# Patient Record
Sex: Male | Born: 1970
Health system: Southern US, Community
[De-identification: ages and names within clinical notes are randomized; demographics above are authoritative.]

## PROBLEM LIST (undated history)

## (undated) DIAGNOSIS — D0362 Melanoma in situ of left upper limb, including shoulder: Secondary | ICD-10-CM

## (undated) HISTORY — DX: Melanoma in situ of left upper limb, including shoulder: D03.62

---

## 2011-05-02 ENCOUNTER — Emergency Department: Payer: Self-pay | Admitting: Emergency Medicine

## 2011-05-02 LAB — COMPREHENSIVE METABOLIC PANEL
Albumin: 3.5 g/dL (ref 3.4–5.0)
Alkaline Phosphatase: 134 U/L (ref 50–136)
Anion Gap: 9 (ref 7–16)
BUN: 11 mg/dL (ref 7–18)
Bilirubin,Total: 0.4 mg/dL (ref 0.2–1.0)
Calcium, Total: 8.4 mg/dL — ABNORMAL LOW (ref 8.5–10.1)
Chloride: 104 mmol/L (ref 98–107)
Co2: 28 mmol/L (ref 21–32)
Creatinine: 1.09 mg/dL (ref 0.60–1.30)
EGFR (African American): >60
EGFR (Non-African Amer.): >60
Glucose: 86 mg/dL (ref 65–99)
Osmolality: 280 (ref 275–301)
Potassium: 3.9 mmol/L (ref 3.5–5.1)
SGOT(AST): 51 U/L — ABNORMAL HIGH (ref 15–37)
SGPT (ALT): 54 U/L
Sodium: 141 mmol/L (ref 136–145)
Total Protein: 7.4 g/dL (ref 6.4–8.2)

## 2011-05-02 LAB — DRUG SCREEN, URINE

## 2011-05-02 LAB — CBC
HCT: 41.3 % (ref 40.0–52.0)
HGB: 13.1 g/dL (ref 13.0–18.0)
MCH: 25.9 pg — ABNORMAL LOW (ref 26.0–34.0)
MCHC: 31.9 g/dL — ABNORMAL LOW (ref 32.0–36.0)
MCV: 81 fL (ref 80–100)
Platelet: 505 10*3/uL — ABNORMAL HIGH (ref 150–440)
RBC: 5.07 10*6/uL (ref 4.40–5.90)
RDW: 18.8 % — ABNORMAL HIGH (ref 11.5–14.5)
WBC: 18.8 10*3/uL — ABNORMAL HIGH (ref 3.8–10.6)

## 2011-05-02 LAB — ETHANOL
Ethanol %: 0.008 % (ref 0.000–0.080)
Ethanol: 8 mg/dL

## 2011-05-02 LAB — TSH: Thyroid Stimulating Horm: 3.72 u[IU]/mL

## 2011-05-02 LAB — ACETAMINOPHEN LEVEL: Acetaminophen: <2 ug/mL

## 2011-05-02 LAB — SALICYLATE LEVEL: Salicylates, Serum: <1.7 mg/dL

## 2015-01-21 ENCOUNTER — Emergency Department
Admission: EM | Admit: 2015-01-21 | Discharge: 2015-01-21 | Disposition: A | Discharge status: Home / Self Care | Payer: 59 | Attending: Emergency Medicine | Admitting: Emergency Medicine

## 2015-01-21 ENCOUNTER — Emergency Department: Payer: 59

## 2015-01-21 ENCOUNTER — Encounter: Payer: Self-pay | Admitting: Emergency Medicine

## 2015-01-21 DIAGNOSIS — Z880 Allergy status to penicillin: Secondary | ICD-10-CM | POA: Insufficient documentation

## 2015-01-21 DIAGNOSIS — J40 Bronchitis, not specified as acute or chronic: Secondary | ICD-10-CM | POA: Insufficient documentation

## 2015-01-21 DIAGNOSIS — R05 Cough: Secondary | ICD-10-CM | POA: Diagnosis present

## 2015-01-21 MED ORDER — PREDNISONE 10 MG PO TABS: 50.0000 mg | ORAL_TABLET | Freq: Every day | ORAL | Status: DC

## 2015-01-21 MED ORDER — ALBUTEROL SULFATE HFA 108 (90 BASE) MCG/ACT IN AERS: 2.0000 | INHALATION_SPRAY | Freq: Four times a day (QID) | RESPIRATORY_TRACT | Status: DC | PRN

## 2015-01-21 MED ORDER — GUAIFENESIN-CODEINE 100-10 MG/5ML PO SOLN: 10.0000 mL | Freq: Three times a day (TID) | ORAL | Status: DC | PRN

## 2015-01-21 NOTE — ED Notes (Addendum)
Increased shortness of breath more at night and when ambulating.  Pt able to speak in full sentences without difficulty at this time. Has been using wife's inhaler and does not have an inhaler. Coughing at times causes headaches. Pt reports having been told he has seasonal asthma and it is worse when its cold. No medications for asthma. Wife reports hearing him wheeze at night.

## 2015-01-21 NOTE — ED Provider Notes (Signed)
[facility] Emergency Department Provider Note ____________________________________________  Time seen: Approximately 10:49 AM  I have reviewed the triage vital signs and the nursing notes.   HISTORY  Chief Complaint Shortness of Breath and Cough   HPI Phillip Gallegos is a 45 y.o. male who presents to the emergency department for evaluation of shortness of breath and cough. Both are worse at night and in the morning. Wife states that she notices that he is wheezing during the night. The past 2 nights he has had 2 get up and use her albuterol inhaler. He has a history of similar symptoms "when the weather gets cold." His never been officially diagnosed with asthma, but using the inhaler does help the symptoms. He denies fever. He denies other URI symptoms or recent illness.   History reviewed. No pertinent past medical history.  There are no active problems to display for this patient.   History reviewed. No pertinent past surgical history.  Current Outpatient Rx  Name  Route  Sig  Dispense  Refill  . albuterol (PROVENTIL HFA;VENTOLIN HFA) 108 (90 Base) MCG/ACT inhaler   Inhalation   Inhale 2 puffs into the lungs every 6 (six) hours as needed for wheezing or shortness of breath.   1 Inhaler   2   . guaiFENesin-codeine 100-10 MG/5ML syrup   Oral   Take 10 mLs by mouth 3 (three) times daily as needed.   120 mL   0   . predniSONE (DELTASONE) 10 MG tablet   Oral   Take 5 tablets (50 mg total) by mouth daily.   25 tablet   0     Allergies Penicillins  History reviewed. No pertinent family history.  Social History Social History  Substance Use Topics  . Smoking status: Never Smoker   . Smokeless tobacco: None  . Alcohol Use: None    Review of Systems onstitutional: No fever/chills Eyes: No visual changes. ENT: No sore throat. Cardiovascular: Denies chest pain. Respiratory: Positive for shortness of breath. Positive for  cough. Gastrointestinal: Negative for abdominal pain. Negative for nausea,  no vomiting.  No diarrhea.  Genitourinary: Negative for dysuria. Musculoskeletal: Negative for body aches Skin: Negative for rash. Neurological: Negative for headaches, Negative for focal weakness or numbness.  10-point ROS otherwise negative.  ____________________________________________   PHYSICAL EXAM:  VITAL SIGNS: ED Triage Vitals  Enc Vitals Group     BP 01/21/15 1036 147/90 mmHg     Pulse Rate 01/21/15 1036 98     Resp 01/21/15 1036 18     Temp 01/21/15 1036 98 F (36.7 C)     Temp src --      SpO2 01/21/15 1036 95 %     Weight 01/21/15 1036 195 lb (88.451 kg)     Height 01/21/15 1036 6' (1.829 m)     Head Cir --      Peak Flow --      Pain Score 01/21/15 1034 1     Pain Loc --      Pain Edu? --      Excl. in GC? --     Constitutional: Alert and oriented. Well appearing and in no acute distress. Eyes: Conjunctivae are normal. PERRL. EOMI. Ears: Bilateral TM normal Head: Atraumatic. Nose: No congestion/rhinnorhea. Mouth/Throat: Mucous membranes are moist.  Oropharynx non-erythematous. Neck: No stridor.  Lymphatic: No cervical lymphadenopathy. Cardiovascular: Normal rate, regular rhythm. Grossly normal heart sounds.  Good peripheral circulation. Respiratory: Normal respiratory effort.  No retractions. Lungs  clear to auscultation throughout. Gastrointestinal: Soft and nontender. No distention. No abdominal bruits. No CVA tenderness. Musculoskeletal: No joint pain reported. Neurologic:  Normal speech and language. No gross focal neurologic deficits are appreciated. Speech is normal. No gait instability. Skin:  Skin is warm, dry and intact. No rash noted. Psychiatric: Mood and affect are normal. Speech and behavior are normal.  ____________________________________________   LABS (all labs ordered are listed, but only abnormal results are displayed)  Labs Reviewed - No data to  display ____________________________________________  EKG  Not indicated ____________________________________________  RADIOLOGY  Chest x-ray negative for acute abnormality per radiology. ____________________________________________   PROCEDURES  Procedure(s) performed: None  Critical Care performed: No  ____________________________________________   INITIAL IMPRESSION / ASSESSMENT AND PLAN / ED COURSE  Pertinent labs & imaging results that were available during my care of the patient were reviewed by me and considered in my medical decision making (see chart for details).   Patient was advised to follow-up with primary care provider of choice for symptoms that are not improving over the next 2-3 days. He was advised to return to the emergency department for symptoms that change or worsen. He will take a five-day course of prednisone and a albuterol inhaler was written for him to use 2 puffs every 4 hours as needed. ____________________________________________   FINAL CLINICAL IMPRESSION(S) / ED DIAGNOSES  Final diagnoses:  Bronchitis       Chinita PesterCari B Zayd Bonet, FNP 01/21/15 1240  Jene Everyobert Kinner, MD 01/21/15 1530

## 2015-01-21 NOTE — ED Notes (Signed)
Reports cough, congestion and wheezing.  No resp distress at this time.

## 2015-02-26 ENCOUNTER — Telehealth: Payer: Self-pay | Admitting: Internal Medicine

## 2015-02-26 NOTE — Telephone Encounter (Signed)
Left message asking pt to call office to schedule new patient appointment 02/05/15/02/13/15 and 02/22/15.

## 2016-02-06 ENCOUNTER — Ambulatory Visit: Payer: 59 | Admitting: Internal Medicine

## 2016-03-25 ENCOUNTER — Ambulatory Visit (INDEPENDENT_AMBULATORY_CARE_PROVIDER_SITE_OTHER): Payer: 59 | Admitting: Internal Medicine

## 2016-03-25 ENCOUNTER — Encounter: Payer: Self-pay | Admitting: Internal Medicine

## 2016-03-25 ENCOUNTER — Encounter (INDEPENDENT_AMBULATORY_CARE_PROVIDER_SITE_OTHER): Payer: Self-pay

## 2016-03-25 VITALS — BP 132/70 | HR 78 | Temp 98.4°F | Ht 72.0 in | Wt 195.0 lb

## 2016-03-25 DIAGNOSIS — J4520 Mild intermittent asthma, uncomplicated: Secondary | ICD-10-CM | POA: Diagnosis not present

## 2016-03-25 DIAGNOSIS — S46819A Strain of other muscles, fascia and tendons at shoulder and upper arm level, unspecified arm, initial encounter: Secondary | ICD-10-CM | POA: Diagnosis not present

## 2016-03-25 DIAGNOSIS — J45909 Unspecified asthma, uncomplicated: Secondary | ICD-10-CM | POA: Insufficient documentation

## 2016-03-25 MED ORDER — CETIRIZINE HCL 10 MG PO TABS: 10.0000 mg | ORAL_TABLET | Freq: Every day | ORAL | 3 refills | Status: DC

## 2016-03-25 MED ORDER — ALBUTEROL SULFATE HFA 108 (90 BASE) MCG/ACT IN AERS: 2.0000 | INHALATION_SPRAY | Freq: Four times a day (QID) | RESPIRATORY_TRACT | 2 refills | Status: DC | PRN

## 2016-03-25 NOTE — Progress Notes (Signed)
HPI  Pt presents to the clinic today to establish care and for management of the conditions listed below.  Allergy Induced Asthma: Mild intermittent. He takes a Zyrtec daily and Albuterol prn. He is requesting a refill of both medications today.  He also c/o pain between the shoulder blades. This started 3-4 weeks ago. He describes the pain as tight. It is intermittent. It is worse with laying down. He denies any numbness or tingling in his arms. 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 He takes Aleve or Ibuprofen as needed with good relief.   Flu: never Tetanus: > 10 year Vision Screening: yearly  Dentist: biannually  History reviewed. No pertinent past medical history.  Current Outpatient Prescriptions  Medication Sig Dispense Refill  . albuterol (PROVENTIL HFA;VENTOLIN HFA) 108 (90 Base) MCG/ACT inhaler Inhale 2 puffs into the lungs every 6 (six) hours as needed for wheezing or shortness of breath. 1 Inhaler 2  . cetirizine (ZYRTEC) 10 MG tablet Take 10 mg by mouth daily.     No current facility-administered medications for this visit.     Allergies  Allergen Reactions  . Penicillins Rash    Family History  Problem Relation Age of Onset  . Hyperlipidemia Father   . Diabetes Father     Social History   Social History  . Marital status: Married    Spouse name: N/A  . Number of children: N/A  . Years of education: N/A   Occupational History  . Not on file.   Social History Main Topics  . Smoking status: Former Games developermoker  . Smokeless tobacco: Never Used  . Alcohol use Not on file  . Drug use: Unknown  . Sexual activity: Not on file   Other Topics Concern  . Not on file   Social History Narrative  . No narrative on file    ROS:  Constitutional: Denies fever, malaise, fatigue, headache or abrupt weight changes.  HEENT: Denies eye pain, eye redness, ear pain, ringing in the ears, wax buildup, runny nose, nasal congestion,  bloody nose, or sore throat. Respiratory: Denies difficulty breathing, shortness of breath, cough or sputum production.   Cardiovascular: Denies chest pain, chest tightness, palpitations or swelling in the hands or feet.  Gastrointestinal: Denies abdominal pain, bloating, constipation, diarrhea or blood in the stool.  GU: Denies frequency, urgency, pain with urination, blood in urine, odor or discharge. Musculoskeletal: Pt reports upper back pain. Denies decrease in range of motion, difficulty with gait, or joint pain and swelling.  Skin: Denies redness, rashes, lesions or ulcercations.  Neurological: Denies dizziness, difficulty with memory, difficulty with speech or problems with balance and coordination.  Psych: Denies anxiety, depression, SI/HI.  No other specific complaints in a complete review of systems (except as listed in HPI above).  PE:  BP 132/70 (BP Location: Left Arm, Patient Position: Sitting, Cuff Size: Normal)   Pulse 78   Temp 98.4 F (36.9 C) (Oral)   Ht 6' (1.829 m)   Wt 195 lb (88.5 kg)   SpO2 98%   BMI 26.45 kg/m  Wt Readings from Last 3 Encounters:  03/25/16 195 lb (88.5 kg)  01/21/15 195 lb (88.5 kg)    General: Appears his stated age, well developed, well nourished in NAD. Cardiovascular: Normal rate and rhythm. S1,S2 noted.  No murmur, rubs or gallops noted.  Pulmonary/Chest: Normal effort and positive vesicular breath sounds. No respiratory distress. No wheezes, rales or ronchi noted.  Musculoskeletal: Normal flexion, extension and rotation of the neck. No bony tenderness noted over the spine. He has tension noted in his trapezius. Neurological: Alert and oriented. Psychiatric: Mood and affect normal. Behavior is normal. Judgment and thought content normal.     BMET    Component Value Date/Time   NA 141 05/02/2011 2217   K 3.9 05/02/2011 2217   CL 104 05/02/2011 2217   CO2 28 05/02/2011 2217   GLUCOSE 86 05/02/2011 2217   BUN 11 05/02/2011 2217    CREATININE 1.09 05/02/2011 2217   CALCIUM 8.4 (L) 05/02/2011 2217   GFRNONAA >60 05/02/2011 2217   GFRAA >60 05/02/2011 2217    Lipid Panel  No results found for: CHOL, TRIG, HDL, CHOLHDL, VLDL, LDLCALC  CBC    Component Value Date/Time   WBC 18.8 (H) 05/02/2011 2217   RBC 5.07 05/02/2011 2217   HGB 13.1 05/02/2011 2217   HCT 41.3 05/02/2011 2217   PLT 505 (H) 05/02/2011 2217   MCV 81 05/02/2011 2217   MCH 25.9 (L) 05/02/2011 2217   MCHC 31.9 (L) 05/02/2011 2217   RDW 18.8 (H) 05/02/2011 2217    Hgb A1C No results found for: HGBA1C   Assessment and Plan:  Trapezius Strain:  Encouraged proper ergonomics while lifting Continue Ibuprofen or Aleve Consider a heating pad or massage Let me know if this worsens  Make an appt for your annual exam BAITY, REGINA, NP \

## 2016-03-25 NOTE — Assessment & Plan Note (Signed)
Continue Zyrtec and Albuterol Refilled today

## 2016-03-25 NOTE — Patient Instructions (Signed)

## 2016-03-25 NOTE — Progress Notes (Signed)
Pre visit review using our clinic review tool, if applicable. No additional management support is needed unless otherwise documented below in the visit note. 

## 2016-03-31 ENCOUNTER — Encounter: Payer: Self-pay | Admitting: Internal Medicine

## 2016-04-15 ENCOUNTER — Encounter: Payer: Self-pay | Admitting: Internal Medicine

## 2016-05-02 ENCOUNTER — Ambulatory Visit: Payer: 59 | Admitting: Internal Medicine

## 2016-05-05 ENCOUNTER — Ambulatory Visit: Payer: 59 | Admitting: Internal Medicine

## 2016-05-12 IMAGING — CR DG CHEST 2V
1 series · 2 of 2 positions shown · non-contrast
Comparison: 05/03/2011

CLINICAL DATA: Short of breath

EXAM:
CHEST  2 VIEW

[Series 1: w chest pa · 0.14mm/px · 2 of 2 slices shown]
[im 1/2]
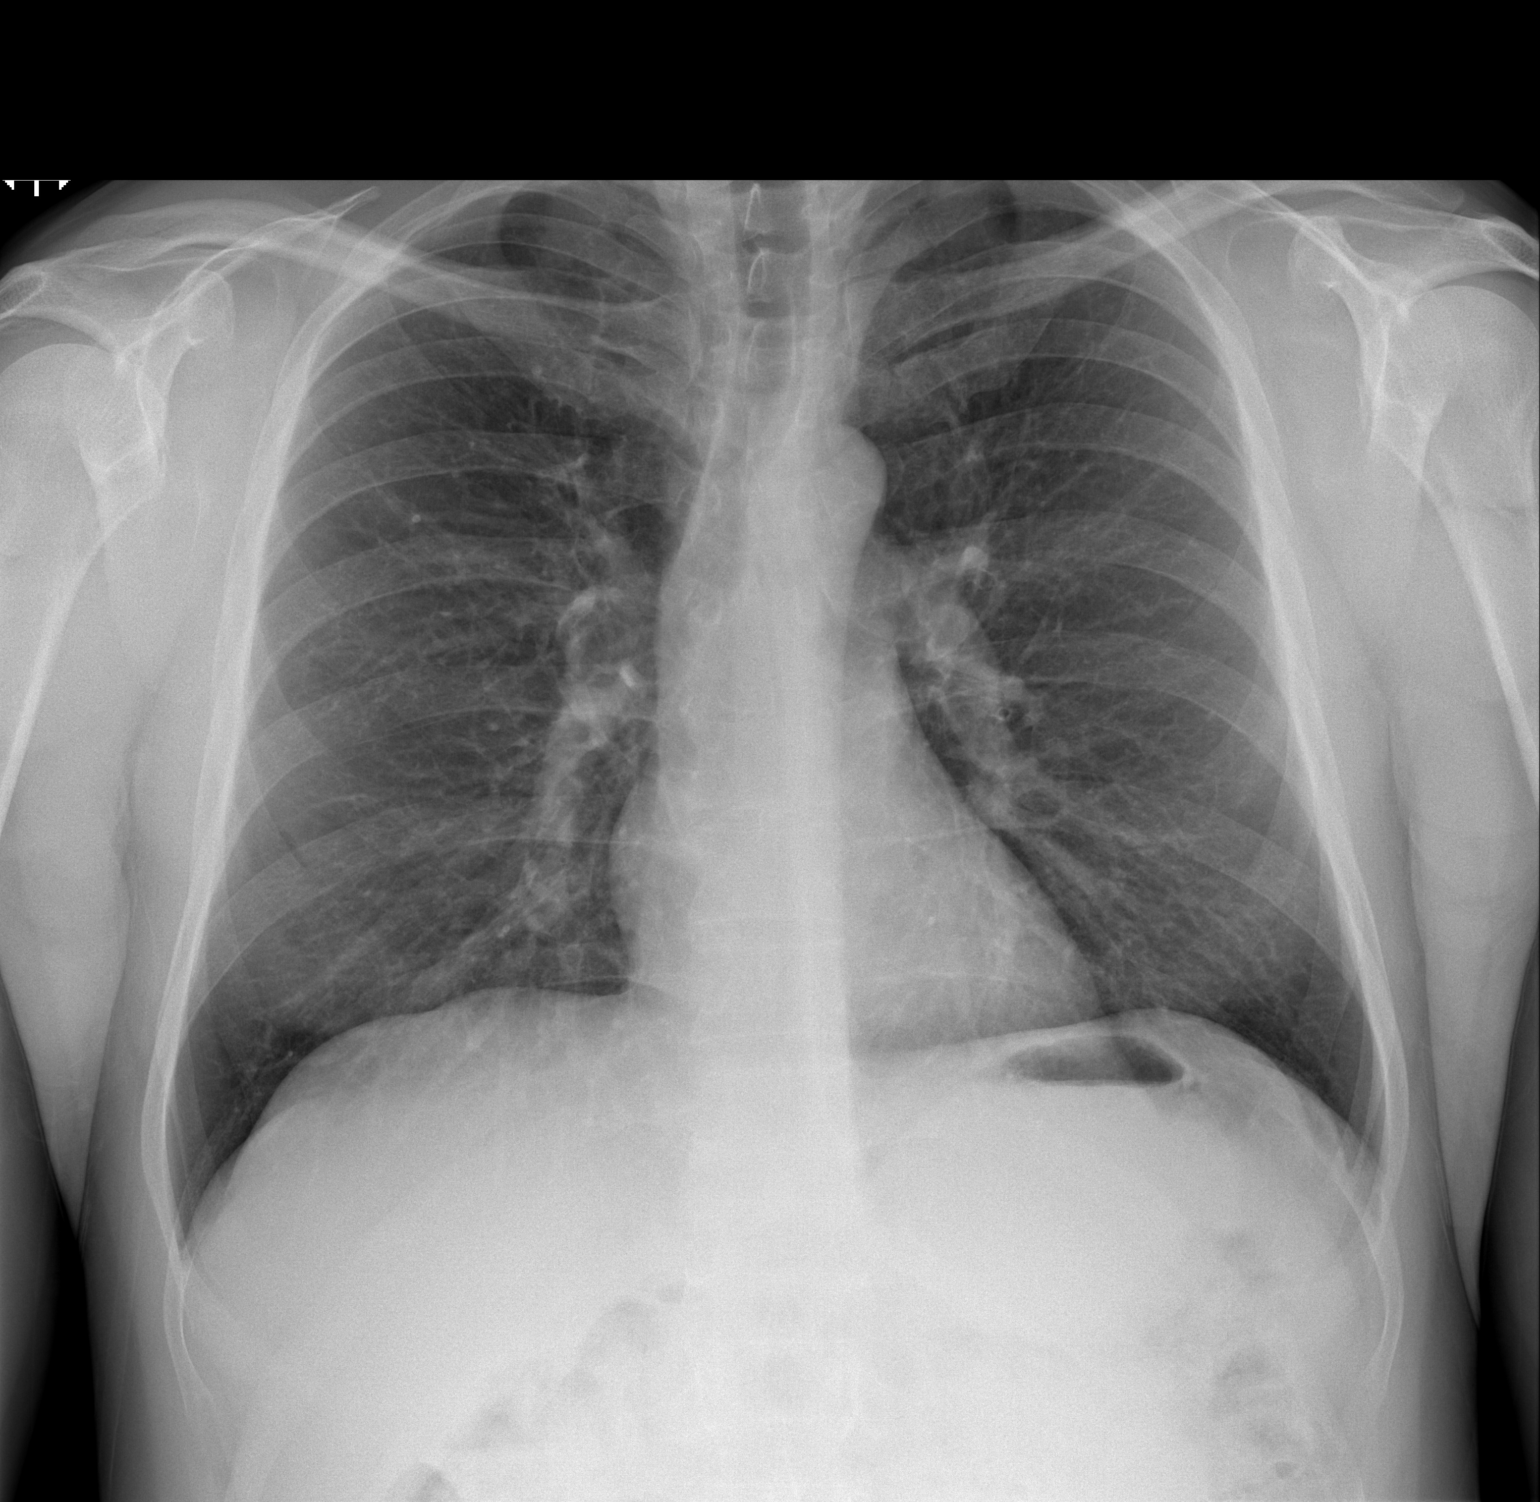
[im 2/2]
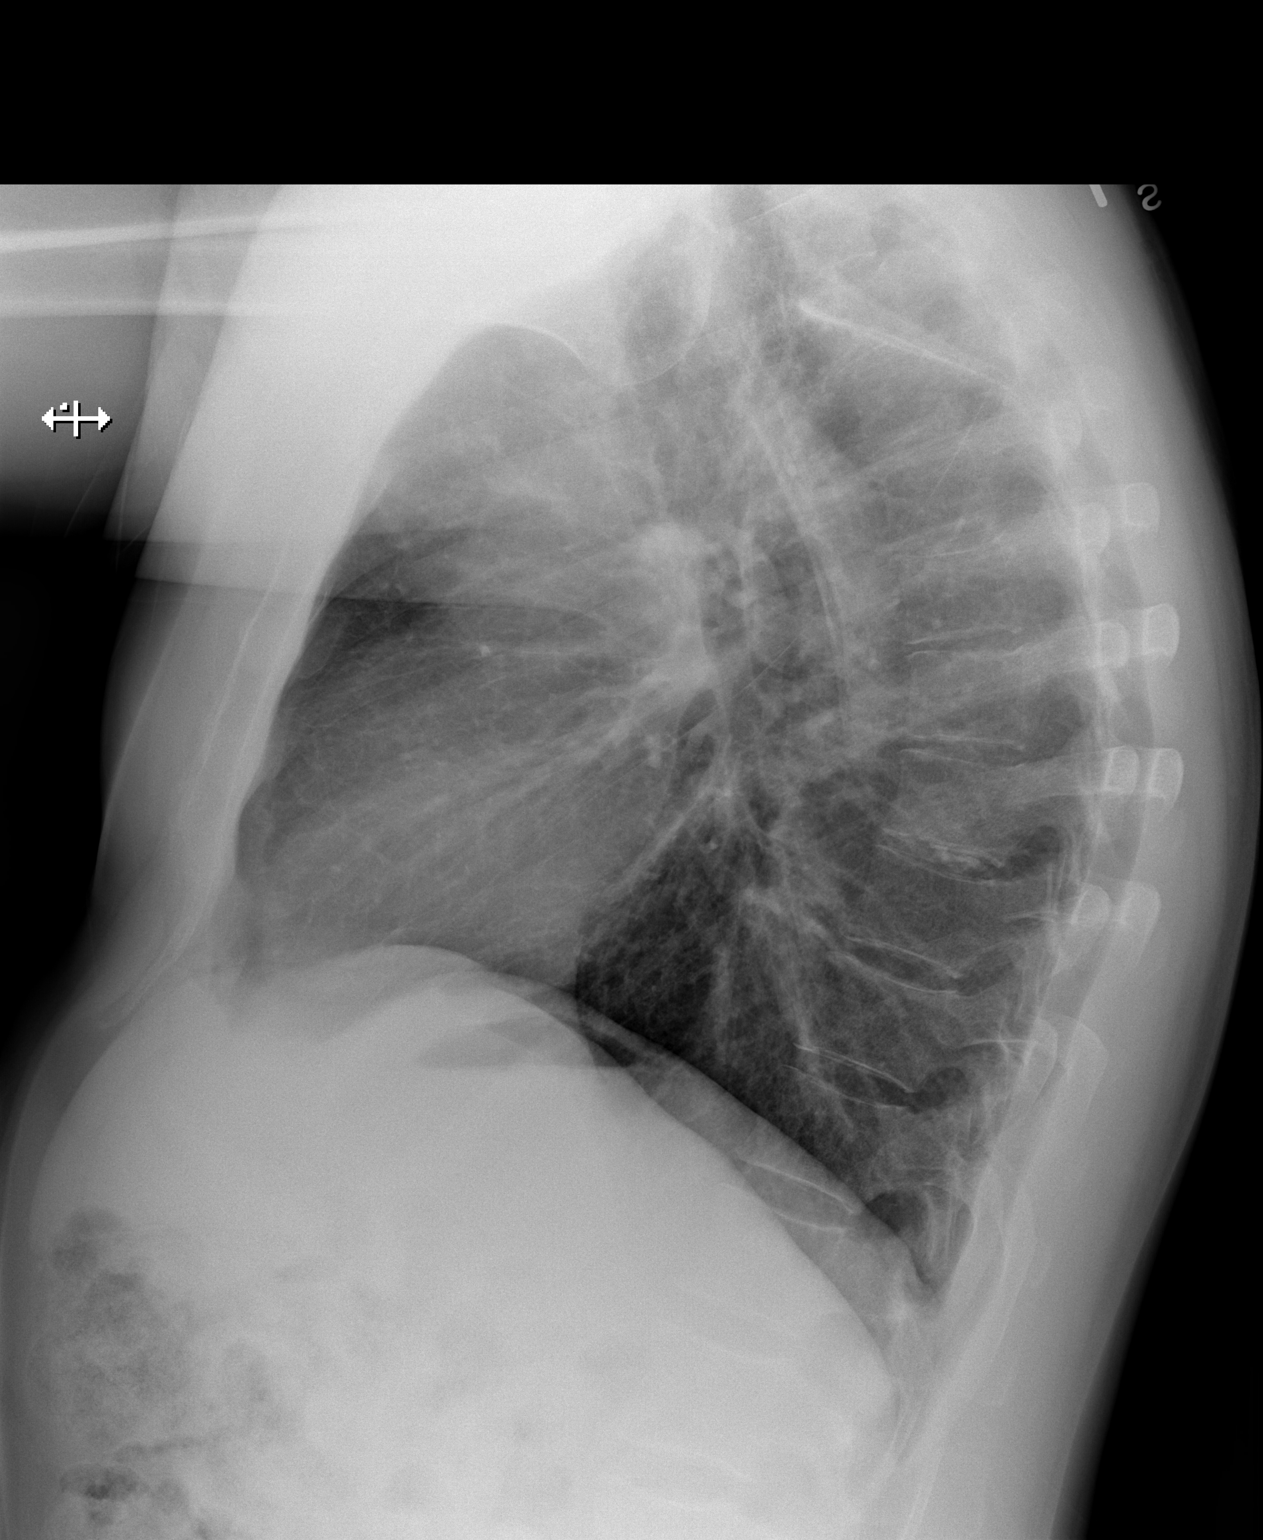

[2 of 2 positions shown; findings below may reference images not displayed]

FINDINGS: Normal heart size. Clear lungs. Lungs are hyperaerated. No
pneumothorax. No pleural effusion.
IMPRESSION: No active cardiopulmonary disease.

## 2016-05-15 ENCOUNTER — Ambulatory Visit (INDEPENDENT_AMBULATORY_CARE_PROVIDER_SITE_OTHER): Payer: 59 | Admitting: Internal Medicine

## 2016-05-15 ENCOUNTER — Encounter: Payer: Self-pay | Admitting: Internal Medicine

## 2016-05-15 VITALS — BP 140/84 | HR 116 | Temp 98.9°F | Wt 198.0 lb

## 2016-05-15 DIAGNOSIS — R0602 Shortness of breath: Secondary | ICD-10-CM | POA: Diagnosis not present

## 2016-05-15 DIAGNOSIS — R059 Cough, unspecified: Secondary | ICD-10-CM

## 2016-05-15 DIAGNOSIS — J4521 Mild intermittent asthma with (acute) exacerbation: Secondary | ICD-10-CM

## 2016-05-15 DIAGNOSIS — R05 Cough: Secondary | ICD-10-CM

## 2016-05-15 MED ORDER — PREDNISONE 10 MG PO TABS: ORAL_TABLET | ORAL | 0 refills | Status: DC

## 2016-05-15 MED ORDER — ALBUTEROL SULFATE (2.5 MG/3ML) 0.083% IN NEBU
2.5000 mg | INHALATION_SOLUTION | Freq: Once | RESPIRATORY_TRACT | Status: AC
  Administered 2016-05-15: 2.5 mg via RESPIRATORY_TRACT

## 2016-05-15 MED ORDER — IPRATROPIUM BROMIDE 0.02 % IN SOLN
0.5000 mg | Freq: Once | RESPIRATORY_TRACT | Status: AC
  Administered 2016-05-15: 0.5 mg via RESPIRATORY_TRACT

## 2016-05-15 MED ORDER — ALBUTEROL SULFATE (2.5 MG/3ML) 0.083% IN NEBU: 2.5000 mg | INHALATION_SOLUTION | RESPIRATORY_TRACT | 1 refills | Status: DC | PRN

## 2016-05-15 MED ORDER — METHYLPREDNISOLONE ACETATE 80 MG/ML IJ SUSP
80.0000 mg | Freq: Once | INTRAMUSCULAR | Status: AC
  Administered 2016-05-15: 80 mg via INTRAMUSCULAR

## 2016-05-15 NOTE — Patient Instructions (Signed)
Asthma, Acute Bronchospasm °Acute bronchospasm caused by asthma is also referred to as an asthma attack. Bronchospasm means your air passages become narrowed. The narrowing is caused by inflammation and tightening of the muscles in the air tubes (bronchi) in your lungs. This can make it hard to breathe or cause you to wheeze and cough. °What are the causes? °Possible triggers are: °· Animal dander from the skin, hair, or feathers of animals. °· Dust mites contained in house dust. °· Cockroaches. °· Pollen from trees or grass. °· Mold. °· Cigarette or tobacco smoke. °· Air pollutants such as dust, household cleaners, hair sprays, aerosol sprays, paint fumes, strong chemicals, or strong odors. °· Cold air or weather changes. Cold air may trigger inflammation. Winds increase molds and pollens in the air. °· Strong emotions such as crying or laughing hard. °· Stress. °· Certain medicines such as aspirin or beta-blockers. °· Sulfites in foods and drinks, such as dried fruits and wine. °· Infections or inflammatory conditions, such as a flu, cold, or inflammation of the nasal membranes (rhinitis). °· Gastroesophageal reflux disease (GERD). GERD is a condition where stomach acid backs up into your esophagus. °· Exercise or strenuous activity. ° °What are the signs or symptoms? °· Wheezing. °· Excessive coughing, particularly at night. °· Chest tightness. °· Shortness of breath. °How is this diagnosed? °Your health care provider will ask you about your medical history and perform a physical exam. A chest X-ray or blood testing may be performed to look for other causes of your symptoms or other conditions that may have triggered your asthma attack. °How is this treated? °Treatment is aimed at reducing inflammation and opening up the airways in your lungs. Most asthma attacks are treated with inhaled medicines. These include quick relief or rescue medicines (such as bronchodilators) and controller medicines (such as inhaled  corticosteroids). These medicines are sometimes given through an inhaler or a nebulizer. Systemic steroid medicine taken by mouth or given through an IV tube also can be used to reduce the inflammation when an attack is moderate or severe. Antibiotic medicines are only used if a bacterial infection is present. °Follow these instructions at home: °· Rest. °· Drink plenty of liquids. This helps the mucus to remain thin and be easily coughed up. Only use caffeine in moderation and do not use alcohol until you have recovered from your illness. °· Do not smoke. Avoid being exposed to secondhand smoke. °· You play a critical role in keeping yourself in good health. Avoid exposure to things that cause you to wheeze or to have breathing problems. °· Keep your medicines up-to-date and available. Carefully follow your health care provider’s treatment plan. °· Take your medicine exactly as prescribed. °· When pollen or pollution is bad, keep windows closed and use an air conditioner or go to places with air conditioning. °· Asthma requires careful medical care. See your health care provider for a follow-up as advised. If you are more than [redacted] weeks pregnant and you were prescribed any new medicines, let your obstetrician know about the visit and how you are doing. Follow up with your health care provider as directed. °· After you have recovered from your asthma attack, make an appointment with your outpatient doctor to talk about ways to reduce the likelihood of future attacks. If you do not have a doctor who manages your asthma, make an appointment with a primary care doctor to discuss your asthma. °Get help right away if: °· You are getting worse. °·   You have trouble breathing. If severe, call your local emergency services (911 in the [place] °· You develop chest pain or discomfort. °· You are vomiting. °· You are not able to keep fluids down. °· You are coughing up yellow, green, brown, or bloody sputum. °· You have a fever  and your symptoms suddenly get worse. °· You have trouble swallowing. °This information is not intended to replace advice given to you by your health care provider. Make sure you discuss any questions you have with your health care provider. °Document Released: 04/23/2006 Document Revised: 06/20/2015 Document Reviewed: 07/14/2012 °Elsevier Interactive Patient Education © 2017 [employer] ° °

## 2016-05-15 NOTE — Progress Notes (Signed)
Pre visit review using our clinic review tool, if applicable. No additional management support is needed unless otherwise documented below in the visit note. 

## 2016-05-15 NOTE — Progress Notes (Signed)
Subjective:    Patient ID: Phillip Gallegos, male    DOB: [birth year], 46 y.o.   MRN: [id number]  HPI  Pt presents to the clinic today with c/o runny nose, cough and shortness of breath. He reports this started yesterday. He is blowing clear mucous out of his nose. The cough is non productive. He has had some chest tightness but denies chest pain. He denies fever, chills or body aches. He has tried his Albuterol inhaler without any relief. He has no history of allergies but does have asthma. He has not had sick contacts that he is aware of.  Review of Systems      History reviewed. No pertinent past medical history.  Current Outpatient Prescriptions  Medication Sig Dispense Refill  . albuterol (PROVENTIL HFA;VENTOLIN HFA) 108 (90 Base) MCG/ACT inhaler Inhale 2 puffs into the lungs every 6 (six) hours as needed for wheezing or shortness of breath. 1 Inhaler 2  . cetirizine (ZYRTEC) 10 MG tablet Take 1 tablet (10 mg total) by mouth daily. 90 tablet 3   No current facility-administered medications for this visit.     Allergies  Allergen Reactions  . Penicillins Rash    Family History  Problem Relation Age of Onset  . Hyperlipidemia Father   . Diabetes Father   . Cancer Neg Hx   . Heart disease Neg Hx   . Stroke Neg Hx     Social History   Social History  . Marital status: Married    Spouse name: N/A  . Number of children: N/A  . Years of education: N/A   Occupational History  . Not on file.   Social History Main Topics  . Smoking status: Former Games developer  . Smokeless tobacco: Never Used  . Alcohol use Yes     Comment: social  . Drug use: No  . Sexual activity: Yes   Other Topics Concern  . Not on file   Social History Narrative  . No narrative on file     Constitutional: Denies fever, malaise, fatigue, headache or abrupt weight changes.  HEENT: Pt reports runny nose. Denies eye pain, eye redness, ear pain, ringing in the ears, wax buildup, nasal congestion,  bloody nose, or sore throat. Respiratory: Pt reports cough and shortness of breath. Denies difficulty breathing, or sputum production.   Cardiovascular: Pt reports chest tightness. Denies chest pain, palpitations or swelling in the hands or feet.   No other specific complaints in a complete review of systems (except as listed in HPI above).  Objective:   Physical Exam  BP 140/84   Pulse (!) 116   Temp 98.9 F (37.2 C) (Oral)   Wt 198 lb (89.8 kg)   SpO2 91%   BMI 26.85 kg/m  Wt Readings from Last 3 Encounters:  05/15/16 198 lb (89.8 kg)  03/25/16 195 lb (88.5 kg)  01/21/15 195 lb (88.5 kg)    General: Appears his stated age, in NAD. Skin: Hot and clammy. HEENT: Head: normal shape and size, no sinus tenderness noted; Ears: Tm's gray and intact, normal light reflex; Nose: mucosa pink and moist, septum midline; Throat/Mouth: Teeth present, mucosa pink and moist, + PND, no exudate, lesions or ulcerations noted.  Neck:  No adenopathy noted Cardiovascular: Tachycardic with normal rhythm. Pulmonary/Chest: Normal effort with bilateral inspiratory and expiratory wheeze noted. No respiratory distress.  BMET    Component Value Date/Time   NA 141 05/02/2011 2217   K 3.9 05/02/2011 2217   CL 104  05/02/2011 2217   CO2 28 05/02/2011 2217   GLUCOSE 86 05/02/2011 2217   BUN 11 05/02/2011 2217   CREATININE 1.09 05/02/2011 2217   CALCIUM 8.4 (L) 05/02/2011 2217   GFRNONAA >60 05/02/2011 2217   GFRAA >60 05/02/2011 2217    Lipid Panel  No results found for: CHOL, TRIG, HDL, CHOLHDL, VLDL, LDLCALC  CBC    Component Value Date/Time   WBC 18.8 (H) 05/02/2011 2217   RBC 5.07 05/02/2011 2217   HGB 13.1 05/02/2011 2217   HCT 41.3 05/02/2011 2217   PLT 505 (H) 05/02/2011 2217   MCV 81 05/02/2011 2217   MCH 25.9 (L) 05/02/2011 2217   MCHC 31.9 (L) 05/02/2011 2217   RDW 18.8 (H) 05/02/2011 2217    Hgb A1C No results found for: HGBA1C          Assessment & Plan:   Asthma  Exacerbation:  Duoneb in office, clinically improved, lungs more open although still wheezing. Sats up to 93%. 80 mg Depo IM today eRx for Pred Taper x 6 days eRx for Albuterol nebs RX for nebulizer machine  RTC as needed or if symptoms persist or worsen Kripa Foskey, NP

## 2016-05-20 ENCOUNTER — Telehealth: Payer: Self-pay

## 2016-05-20 NOTE — Telephone Encounter (Signed)
Dollie with [facility] left /vm requesting cb about possible additional therapy for pts asthma; I called [facility] and Clif[facility] Custard said new policy for [facility] with certain dx; wants to know if Pamala Hurry NP thinks adding a daily inhaler would benefit pt.Please advise. Pt last seen 05/15/16.

## 2016-05-20 NOTE — Telephone Encounter (Signed)
Not at this time. If he continues to have issues, we can consider it.

## 2016-05-20 NOTE — Telephone Encounter (Signed)
Notification faxed back to [facility] with NO pt does not need daily therapy

## 2016-05-22 ENCOUNTER — Encounter: Payer: Self-pay | Admitting: Internal Medicine

## 2016-05-22 ENCOUNTER — Ambulatory Visit (INDEPENDENT_AMBULATORY_CARE_PROVIDER_SITE_OTHER): Payer: 59 | Admitting: Internal Medicine

## 2016-05-22 VITALS — BP 134/80 | HR 76 | Temp 97.9°F | Ht 72.0 in | Wt 181.2 lb

## 2016-05-22 DIAGNOSIS — Z23 Encounter for immunization: Secondary | ICD-10-CM | POA: Diagnosis not present

## 2016-05-22 DIAGNOSIS — Z0000 Encounter for general adult medical examination without abnormal findings: Secondary | ICD-10-CM | POA: Diagnosis not present

## 2016-05-22 DIAGNOSIS — R030 Elevated blood-pressure reading, without diagnosis of hypertension: Secondary | ICD-10-CM

## 2016-05-22 LAB — CBC
HCT: 43.2 % (ref 39.0–52.0)
Hemoglobin: 14 g/dL (ref 13.0–17.0)
MCHC: 32.5 g/dL (ref 30.0–36.0)
MCV: 83.1 fl (ref 78.0–100.0)
Platelets: 513 10*3/uL — ABNORMAL HIGH (ref 150.0–400.0)
RBC: 5.2 Mil/uL (ref 4.22–5.81)
RDW: 16.8 % — ABNORMAL HIGH (ref 11.5–15.5)
WBC: 11.3 10*3/uL — ABNORMAL HIGH (ref 4.0–10.5)

## 2016-05-22 LAB — COMPREHENSIVE METABOLIC PANEL
ALT: 26 U/L (ref 0–53)
AST: 17 U/L (ref 0–37)
Albumin: 4.6 g/dL (ref 3.5–5.2)
Alkaline Phosphatase: 75 U/L (ref 39–117)
BUN: 16 mg/dL (ref 6–23)
CO2: 29 mEq/L (ref 19–32)
Calcium: 9.7 mg/dL (ref 8.4–10.5)
Chloride: 106 mEq/L (ref 96–112)
Creatinine, Ser: 0.7 mg/dL (ref 0.40–1.50)
GFR: 130.25 mL/min (ref >60.00)
Glucose, Bld: 97 mg/dL (ref 70–99)
Potassium: 4.7 mEq/L (ref 3.5–5.1)
Sodium: 139 mEq/L (ref 135–145)
Total Bilirubin: 0.5 mg/dL (ref 0.2–1.2)
Total Protein: 7.9 g/dL (ref 6.0–8.3)

## 2016-05-22 LAB — LIPID PANEL
Cholesterol: 161 mg/dL (ref 0–200)
HDL: 52.4 mg/dL (ref >39.00)
LDL Cholesterol: 99 mg/dL (ref 0–99)
NonHDL: 109.03
Total CHOL/HDL Ratio: 3
Triglycerides: 48 mg/dL (ref 0.0–149.0)
VLDL: 9.6 mg/dL (ref 0.0–40.0)

## 2016-05-22 NOTE — Progress Notes (Signed)
Subjective:    Patient ID: Phillip Gallegos, male    DOB: [birth year], 46 y.o.   MRN: [id number]  HPI  Pt presents to the clinic today for his annual exam.  Flu: 03/2016 Tetanus: > 10 years ago Vision Screening: annually Dentist: biannually  Diet: He does eat meat. He eats a few fruits and veggies. He does not eat fried foods. He drinks mostly water. Exercise: None  Review of Systems      No past medical history on file.  Current Outpatient Prescriptions  Medication Sig Dispense Refill  . albuterol (PROVENTIL HFA;VENTOLIN HFA) 108 (90 Base) MCG/ACT inhaler Inhale 2 puffs into the lungs every 6 (six) hours as needed for wheezing or shortness of breath. 1 Inhaler 2  . albuterol (PROVENTIL) (2.5 MG/3ML) 0.083% nebulizer solution Take 3 mLs (2.5 mg total) by nebulization every 4 (four) hours as needed for wheezing or shortness of breath. 150 mL 1  . cetirizine (ZYRTEC) 10 MG tablet Take 1 tablet (10 mg total) by mouth daily. 90 tablet 3  . predniSONE (DELTASONE) 10 MG tablet Take 6 tabs day 1, 5 tabs day 2, 4 tabs day 3, 3 tabs day 4, 2 tabs day 5, 1 tab day 6 21 tablet 0   No current facility-administered medications for this visit.     Allergies  Allergen Reactions  . Penicillins Rash    Family History  Problem Relation Age of Onset  . Hyperlipidemia Father   . Diabetes Father   . Cancer Neg Hx   . Heart disease Neg Hx   . Stroke Neg Hx     Social History   Social History  . Marital status: Married    Spouse name: N/A  . Number of children: N/A  . Years of education: N/A   Occupational History  . Not on file.   Social History Main Topics  . Smoking status: Former Games developermoker  . Smokeless tobacco: Never Used  . Alcohol use Yes     Comment: social  . Drug use: No  . Sexual activity: Yes   Other Topics Concern  . Not on file   Social History Narrative  . No narrative on file     Constitutional: Denies fever, malaise, fatigue, headache or abrupt weight  changes.  HEENT: Denies eye pain, eye redness, ear pain, ringing in the ears, wax buildup, runny nose, nasal congestion, bloody nose, or sore throat. Respiratory: Denies difficulty breathing, shortness of breath, cough or sputum production.   Cardiovascular: Denies chest pain, chest tightness, palpitations or swelling in the hands or feet.  Gastrointestinal: Denies abdominal pain, bloating, constipation, diarrhea or blood in the stool.  GU: Denies urgency, frequency, pain with urination, burning sensation, blood in urine, odor or discharge. Musculoskeletal: Denies decrease in range of motion, difficulty with gait, muscle pain or joint pain and swelling.  Skin: Denies redness, rashes, lesions or ulcercations.  Neurological: Denies dizziness, difficulty with memory, difficulty with speech or problems with balance and coordination.  Psych: Denies anxiety, depression, SI/HI.  No other specific complaints in a complete review of systems (except as listed in HPI above).  Objective:   Physical Exam  BP 134/80   Pulse 76   Temp 97.9 F (36.6 C) (Oral)   Ht 6' (1.829 m)   Wt 181 lb 4 oz (82.2 kg)   SpO2 98%   BMI 24.58 kg/m  Wt Readings from Last 3 Encounters:  05/22/16 181 lb 4 oz (82.2 kg)  05/15/16 198 lb (  89.8 kg)  03/25/16 195 lb (88.5 kg)    General: Appears his stated age, in NAD. Skin: Warm, dry and intact.  HEENT: Head: normal shape and size; Eyes: sclera white, no icterus, conjunctiva pink, PERRLA and EOMs intact; Ears: Tm's gray and intact, normal light reflex; Throat/Mouth: Teeth present, mucosa pink and moist, no exudate, lesions or ulcerations noted.  Neck:  Neck supple, trachea midline. No masses, lumps or thyromegaly present.  Cardiovascular: Normal rate and rhythm. S1,S2 noted.  No murmur, rubs or gallops noted. No JVD or BLE edema.  Pulmonary/Chest: Normal effort and positive vesicular breath sounds. No respiratory distress. No wheezes, rales or ronchi noted.  Abdomen:  Soft and nontender. Normal bowel sounds. No distention or masses noted. Liver, spleen and kidneys non palpable. Musculoskeletal: Strength 5/5 BUE/BLE. No difficulty with gait.  Neurological: Alert and oriented. Cranial nerves II-XII grossly intact. Coordination normal.  Psychiatric: Mood and affect normal. Behavior is normal. Judgment and thought content normal.     BMET    Component Value Date/Time   NA 141 05/02/2011 2217   K 3.9 05/02/2011 2217   CL 104 05/02/2011 2217   CO2 28 05/02/2011 2217   GLUCOSE 86 05/02/2011 2217   BUN 11 05/02/2011 2217   CREATININE 1.09 05/02/2011 2217   CALCIUM 8.4 (L) 05/02/2011 2217   GFRNONAA >60 05/02/2011 2217   GFRAA >60 05/02/2011 2217    Lipid Panel  No results found for: CHOL, TRIG, HDL, CHOLHDL, VLDL, LDLCALC  CBC    Component Value Date/Time   WBC 18.8 (H) 05/02/2011 2217   RBC 5.07 05/02/2011 2217   HGB 13.1 05/02/2011 2217   HCT 41.3 05/02/2011 2217   PLT 505 (H) 05/02/2011 2217   MCV 81 05/02/2011 2217   MCH 25.9 (L) 05/02/2011 2217   MCHC 31.9 (L) 05/02/2011 2217   RDW 18.8 (H) 05/02/2011 2217    Hgb A1C No results found for: HGBA1C          Assessment & Plan:   Preventative Health Maintenance:  Flu shot UTD Tdap today Encouraged him to consume a balanced diet and exercise regimen Advised him to see an eye doctor and dentist annually Will check CBC, CMET, Lipid profile today  Elevated Blood Pressure:  Better with recent 18 lb weight loss He declines blood pressure medication at this time Will monitor  RTC in 1 year, sooner if needed Nicki Reaper, NP

## 2016-05-22 NOTE — Addendum Note (Signed)
Addended by: Roena MaladyEVONTENNO, Adeeb Konecny Y on: 05/22/2016 04:49 PM   Modules accepted: Orders

## 2016-05-22 NOTE — Patient Instructions (Signed)
 Health Maintenance, Male A healthy lifestyle and preventive care is important for your health and wellness. Ask your health care provider about what schedule of regular examinations is right for you. What should I know about weight and diet?  Eat a Healthy Diet  Eat plenty of vegetables, fruits, whole grains, low-fat dairy products, and lean protein.  Do not eat a lot of foods high in solid fats, added sugars, or salt. Maintain a Healthy Weight  Regular exercise can help you achieve or maintain a healthy weight. You should:  Do at least 150 minutes of exercise each week. The exercise should increase your heart rate and make you sweat (moderate-intensity exercise).  Do strength-training exercises at least twice a week. Watch Your Levels of Cholesterol and Blood Lipids  Have your blood tested for lipids and cholesterol every 5 years starting at 44 years of age. If you are at high risk for heart disease, you should start having your blood tested when you are 44 years old. You may need to have your cholesterol levels checked more often if:  Your lipid or cholesterol levels are high.  You are older than 44 years of age.  You are at high risk for heart disease. What should I know about cancer screening? Many types of cancers can be detected early and may often be prevented. Lung Cancer  You should be screened every year for lung cancer if:  You are a current smoker who has smoked for at least 30 years.  You are a former smoker who has quit within the past 15 years.  Talk to your health care provider about your screening options, when you should start screening, and how often you should be screened. Colorectal Cancer  Routine colorectal cancer screening usually begins at 44 years of age and should be repeated every 5-10 years until you are 44 years old. You may need to be screened more often if early forms of precancerous polyps or small growths are found. Your health care provider  may recommend screening at an earlier age if you have risk factors for colon cancer.  Your health care provider may recommend using home test kits to check for hidden blood in the stool.  A small camera at the end of a tube can be used to examine your colon (sigmoidoscopy or colonoscopy). This checks for the earliest forms of colorectal cancer. Prostate and Testicular Cancer  Depending on your age and overall health, your health care provider may do certain tests to screen for prostate and testicular cancer.  Talk to your health care provider about any symptoms or concerns you have about testicular or prostate cancer. Skin Cancer  Check your skin from head to toe regularly.  Tell your health care provider about any new moles or changes in moles, especially if:  There is a change in a mole's size, shape, or color.  You have a mole that is larger than a pencil eraser.  Always use sunscreen. Apply sunscreen liberally and repeat throughout the day.  Protect yourself by wearing long sleeves, pants, a wide-brimmed hat, and sunglasses when outside. What should I know about heart disease, diabetes, and high blood pressure?  If you are 18-39 years of age, have your blood pressure checked every 3-5 years. If you are 40 years of age or older, have your blood pressure checked every year. You should have your blood pressure measured twice-once when you are at a hospital or clinic, and once when you are not at   a hospital or clinic. Record the average of the two measurements. To check your blood pressure when you are not at a hospital or clinic, you can use:  An automated blood pressure machine at a pharmacy.  A home blood pressure monitor.  Talk to your health care provider about your target blood pressure.  If you are between 45-79 years old, ask your health care provider if you should take aspirin to prevent heart disease.  Have regular diabetes screenings by checking your fasting blood sugar  level.  If you are at a normal weight and have a low risk for diabetes, have this test once every three years after the age of 45.  If you are overweight and have a high risk for diabetes, consider being tested at a younger age or more often.  A one-time screening for abdominal aortic aneurysm (AAA) by ultrasound is recommended for men aged 65-75 years who are current or former smokers. What should I know about preventing infection? Hepatitis B  If you have a higher risk for hepatitis B, you should be screened for this virus. Talk with your health care provider to find out if you are at risk for hepatitis B infection. Hepatitis C  Blood testing is recommended for:  Everyone born from 1945 through 1965.  Anyone with known risk factors for hepatitis C. Sexually Transmitted Diseases (STDs)  You should be screened each year for STDs including gonorrhea and chlamydia if:  You are sexually active and are younger than 44 years of age.  You are older than 44 years of age and your health care provider tells you that you are at risk for this type of infection.  Your sexual activity has changed since you were last screened and you are at an increased risk for chlamydia or gonorrhea. Ask your health care provider if you are at risk.  Talk with your health care provider about whether you are at high risk of being infected with HIV. Your health care provider may recommend a prescription medicine to help prevent HIV infection. What else can I do?  Schedule regular health, dental, and eye exams.  Stay current with your vaccines (immunizations).  Do not use any tobacco products, such as cigarettes, chewing tobacco, and e-cigarettes. If you need help quitting, ask your health care provider.  Limit alcohol intake to no more than 2 drinks per day. One drink equals 12 ounces of beer, 5 ounces of wine, or 1 ounces of hard liquor.  Do not use street drugs.  Do not share needles.  Ask your health  care provider for help if you need support or information about quitting drugs.  Tell your health care provider if you often feel depressed.  Tell your health care provider if you have ever been abused or do not feel safe at home. This information is not intended to replace advice given to you by your health care provider. Make sure you discuss any questions you have with your health care provider. Document Released: 07/05/2007 Document Revised: 09/05/2015 Document Reviewed: 10/10/2014 Elsevier Interactive Patient Education  2017 [employer]  

## 2016-05-23 ENCOUNTER — Encounter: Payer: Self-pay | Admitting: Internal Medicine

## 2016-05-23 MED ORDER — FLUTICASONE PROPIONATE HFA 44 MCG/ACT IN AERO: 1.0000 | INHALATION_SPRAY | Freq: Two times a day (BID) | RESPIRATORY_TRACT | 12 refills | Status: DC

## 2016-07-16 ENCOUNTER — Encounter: Payer: Self-pay | Admitting: Internal Medicine

## 2016-07-17 MED ORDER — FLUTICASONE PROPIONATE HFA 44 MCG/ACT IN AERO: 1.0000 | INHALATION_SPRAY | Freq: Two times a day (BID) | RESPIRATORY_TRACT | 3 refills | Status: DC

## 2016-09-08 ENCOUNTER — Encounter: Payer: Self-pay | Admitting: Internal Medicine

## 2016-09-08 MED ORDER — FLUTICASONE PROPIONATE 50 MCG/ACT NA SUSP: 2.0000 | Freq: Every day | NASAL | 1 refills | Status: DC

## 2016-11-14 ENCOUNTER — Other Ambulatory Visit: Payer: Self-pay

## 2016-11-14 MED ORDER — ALBUTEROL SULFATE HFA 108 (90 BASE) MCG/ACT IN AERS: 2.0000 | INHALATION_SPRAY | Freq: Four times a day (QID) | RESPIRATORY_TRACT | 2 refills | Status: DC | PRN

## 2017-01-10 DIAGNOSIS — J029 Acute pharyngitis, unspecified: Secondary | ICD-10-CM | POA: Diagnosis not present

## 2017-03-06 ENCOUNTER — Ambulatory Visit: Payer: 59 | Admitting: Internal Medicine

## 2017-03-06 ENCOUNTER — Ambulatory Visit: Payer: Self-pay | Admitting: Internal Medicine

## 2017-03-06 VITALS — BP 130/84 | HR 78 | Temp 98.5°F | Wt 193.0 lb

## 2017-03-06 DIAGNOSIS — H6123 Impacted cerumen, bilateral: Secondary | ICD-10-CM | POA: Diagnosis not present

## 2017-03-13 ENCOUNTER — Encounter: Payer: Self-pay | Admitting: Internal Medicine

## 2017-03-13 NOTE — Progress Notes (Signed)
Subjective:    Patient ID: Phillip Gallegos, male    DOB: 1970-11-15, 47 y.o.   MRN: [id number]  HPI  Pt presents to the clinic today with c/o right ear pain. He reports this started 2-3 days ago. He describes the ear pain as achy and full. The pain radiates down to his jaw. He denies drainage from the ear or decreased hearing. He denies runny nose, nasal congestion or sore throat. He has tried Flonase without any relief.  Review of Systems      No past medical history on file.  Current Outpatient Medications  Medication Sig Dispense Refill  . albuterol (PROVENTIL HFA;VENTOLIN HFA) 108 (90 Base) MCG/ACT inhaler Inhale 2 puffs into the lungs every 6 (six) hours as needed for wheezing or shortness of breath. 1 Inhaler 2  . albuterol (PROVENTIL) (2.5 MG/3ML) 0.083% nebulizer solution Take 3 mLs (2.5 mg total) by nebulization every 4 (four) hours as needed for wheezing or shortness of breath. 150 mL 1  . fluticasone (FLONASE) 50 MCG/ACT nasal spray Place 2 sprays into both nostrils daily. 16 g 1  . fluticasone (FLOVENT HFA) 44 MCG/ACT inhaler Inhale 1 puff into the lungs 2 (two) times daily. 3 Inhaler 3  . cetirizine (ZYRTEC) 10 MG tablet Take 1 tablet (10 mg total) by mouth daily. (Patient not taking: Reported on 03/06/2017) 90 tablet 3   No current facility-administered medications for this visit.     Allergies  Allergen Reactions  . Penicillins Rash    Family History  Problem Relation Age of Onset  . Hyperlipidemia Father   . Diabetes Father   . Cancer Neg Hx   . Heart disease Neg Hx   . Stroke Neg Hx     Social History   Socioeconomic History  . Marital status: Married    Spouse name: Not on file  . Number of children: Not on file  . Years of education: Not on file  . Highest education level: Not on file  Social Needs  . Financial resource strain: Not on file  . Food insecurity - worry: Not on file  . Food insecurity - inability: Not on file  . Transportation needs -  medical: Not on file  . Transportation needs - non-medical: Not on file  Occupational History  . Not on file  Tobacco Use  . Smoking status: Former Games developer  . Smokeless tobacco: Never Used  Substance and Sexual Activity  . Alcohol use: Yes    Comment: social  . Drug use: No  . Sexual activity: Yes  Other Topics Concern  . Not on file  Social History Narrative  . Not on file     Constitutional: Denies fever, malaise, fatigue, headache or abrupt weight changes.  HEENT: Pt reports right ear pain. Denies eye pain, eye redness, ringing in the ears, wax buildup, runny nose, nasal congestion, bloody nose, or sore throat.   No other specific complaints in a complete review of systems (except as listed in HPI above).  Objective:   Physical Exam  BP 130/84   Pulse 78   Temp 98.5 F (36.9 C) (Oral)   Wt 193 lb (87.5 kg)   SpO2 98%   BMI 26.18 kg/m  Wt Readings from Last 3 Encounters:  03/06/17 193 lb (87.5 kg)  05/22/16 181 lb 4 oz (82.2 kg)  05/15/16 198 lb (89.8 kg)    General: Appears his stated age, well developed, well nourished in NAD. HEENT:  Ears: Bilateral cerumen impaction;  BMET    Component Value Date/Time   NA 139 05/22/2016 1457   NA 141 05/02/2011 2217   K 4.7 05/22/2016 1457   K 3.9 05/02/2011 2217   CL 106 05/22/2016 1457   CL 104 05/02/2011 2217   CO2 29 05/22/2016 1457   CO2 28 05/02/2011 2217   GLUCOSE 97 05/22/2016 1457   GLUCOSE 86 05/02/2011 2217   BUN 16 05/22/2016 1457   BUN 11 05/02/2011 2217   CREATININE 0.70 05/22/2016 1457   CREATININE 1.09 05/02/2011 2217   CALCIUM 9.7 05/22/2016 1457   CALCIUM 8.4 (L) 05/02/2011 2217   GFRNONAA >60 05/02/2011 2217   GFRAA >60 05/02/2011 2217    Lipid Panel     Component Value Date/Time   CHOL 161 05/22/2016 1457   TRIG 48.0 05/22/2016 1457   HDL 52.40 05/22/2016 1457   CHOLHDL 3 05/22/2016 1457   VLDL 9.6 05/22/2016 1457   LDLCALC 99 05/22/2016 1457    CBC    Component Value Date/Time    WBC 11.3 (H) 05/22/2016 1457   RBC 5.20 05/22/2016 1457   HGB 14.0 05/22/2016 1457   HGB 13.1 05/02/2011 2217   HCT 43.2 05/22/2016 1457   HCT 41.3 05/02/2011 2217   PLT 513.0 (H) 05/22/2016 1457   PLT 505 (H) 05/02/2011 2217   MCV 83.1 05/22/2016 1457   MCV 81 05/02/2011 2217   MCH 25.9 (L) 05/02/2011 2217   MCHC 32.5 05/22/2016 1457   RDW 16.8 (H) 05/22/2016 1457   RDW 18.8 (H) 05/02/2011 2217    Hgb A1C No results found for: HGBA1C          Assessment & Plan:   Bilateral Cerumen Impaction:  Manual lavage by CMA- unsuccessful Provider removed wax with cerumen spoon Advised him to try Debrox 2 x week to prevent wax buildup  Return precautions discussed Nicki ReaperBAITY, REGINA, NP

## 2017-03-13 NOTE — Patient Instructions (Signed)
Place cerumen impaction patient instructions here.

## 2017-03-15 ENCOUNTER — Other Ambulatory Visit: Payer: Self-pay | Admitting: Internal Medicine

## 2017-04-05 ENCOUNTER — Other Ambulatory Visit: Payer: Self-pay | Admitting: Internal Medicine

## 2017-05-22 ENCOUNTER — Encounter: Payer: Self-pay | Admitting: Internal Medicine

## 2017-05-29 ENCOUNTER — Encounter: Payer: Self-pay | Admitting: Internal Medicine

## 2017-05-29 ENCOUNTER — Ambulatory Visit (INDEPENDENT_AMBULATORY_CARE_PROVIDER_SITE_OTHER): Payer: 59 | Admitting: Internal Medicine

## 2017-05-29 VITALS — BP 130/84 | HR 81 | Temp 98.5°F | Ht 72.0 in | Wt 198.0 lb

## 2017-05-29 DIAGNOSIS — J4520 Mild intermittent asthma, uncomplicated: Secondary | ICD-10-CM

## 2017-05-29 DIAGNOSIS — G2581 Restless legs syndrome: Secondary | ICD-10-CM | POA: Diagnosis not present

## 2017-05-29 DIAGNOSIS — Z0001 Encounter for general adult medical examination with abnormal findings: Secondary | ICD-10-CM

## 2017-05-29 DIAGNOSIS — I83813 Varicose veins of bilateral lower extremities with pain: Secondary | ICD-10-CM | POA: Diagnosis not present

## 2017-05-29 MED ORDER — PRAMIPEXOLE DIHYDROCHLORIDE 0.25 MG PO TABS: 0.2500 mg | ORAL_TABLET | Freq: Every day | ORAL | 2 refills | Status: DC

## 2017-05-29 NOTE — Patient Instructions (Signed)

## 2017-05-30 ENCOUNTER — Encounter: Payer: Self-pay | Admitting: Internal Medicine

## 2017-05-30 DIAGNOSIS — I83813 Varicose veins of bilateral lower extremities with pain: Secondary | ICD-10-CM | POA: Insufficient documentation

## 2017-05-30 DIAGNOSIS — G2581 Restless legs syndrome: Secondary | ICD-10-CM | POA: Insufficient documentation

## 2017-05-30 LAB — COMPREHENSIVE METABOLIC PANEL
AG Ratio: 1.6 (calc) (ref 1.0–2.5)
ALT: 31 U/L (ref 9–46)
AST: 28 U/L (ref 10–40)
Albumin: 4.6 g/dL (ref 3.6–5.1)
Alkaline phosphatase (APISO): 80 U/L (ref 40–115)
BUN: 14 mg/dL (ref 7–25)
CO2: 25 mmol/L (ref 20–32)
Calcium: 9.7 mg/dL (ref 8.6–10.3)
Chloride: 103 mmol/L (ref 98–110)
Creat: 0.71 mg/dL (ref 0.60–1.35)
Globulin: 2.8 g/dL (calc) (ref 1.9–3.7)
Glucose, Bld: 89 mg/dL (ref 65–99)
Potassium: 5 mmol/L (ref 3.5–5.3)
Sodium: 138 mmol/L (ref 135–146)
Total Bilirubin: 0.6 mg/dL (ref 0.2–1.2)
Total Protein: 7.4 g/dL (ref 6.1–8.1)

## 2017-05-30 LAB — CBC
HCT: 40.8 % (ref 38.5–50.0)
Hemoglobin: 13.7 g/dL (ref 13.2–17.1)
MCH: 27.3 pg (ref 27.0–33.0)
MCHC: 33.6 g/dL (ref 32.0–36.0)
MCV: 81.4 fL (ref 80.0–100.0)
MPV: 11 fL (ref 7.5–12.5)
Platelets: 467 10*3/uL — ABNORMAL HIGH (ref 140–400)
RBC: 5.01 10*6/uL (ref 4.20–5.80)
RDW: 13.8 % (ref 11.0–15.0)
WBC: 8.3 10*3/uL (ref 3.8–10.8)

## 2017-05-30 LAB — LIPID PANEL
Cholesterol: 160 mg/dL (ref <200)
HDL: 38 mg/dL — ABNORMAL LOW (ref >40)
LDL Cholesterol (Calc): 107 mg/dL (calc) — ABNORMAL HIGH
Non-HDL Cholesterol (Calc): 122 mg/dL (calc) (ref <130)
Total CHOL/HDL Ratio: 4.2 (calc) (ref <5.0)
Triglycerides: 60 mg/dL (ref <150)

## 2017-05-30 LAB — TSH: TSH: 3.4 mIU/L (ref 0.40–4.50)

## 2017-05-30 NOTE — Progress Notes (Signed)
Subjective:    Patient ID: Phillip Gallegos, male    DOB: December 21, 1970, 47 y.o.   MRN: [id number]  HPI  Pt presents to the clinic today for his annual exam. He is also due to follow up chronic conditions.  Allergy Induced Asthma: Controlled on Zyrtec, Flonase, Flovent and Albuterol.  Flu: 08/2016 Tetanus: 05/2016 Vision Screening: annually Dentist: biannually  Diet: He does eat meat. He consumes more fruits than veggies. He tries to avoid fried foods. He drinks mostly water. Exercise: None  Review of Systems      History reviewed. No pertinent past medical history.  Current Outpatient Medications  Medication Sig Dispense Refill  . albuterol (PROVENTIL HFA;VENTOLIN HFA) 108 (90 Base) MCG/ACT inhaler TAKE 2 PUFFS BY MOUTH EVERY 6 HOURS AS NEEDED FOR WHEEZE OR SHORTNESS OF BREATH 8.5 Inhaler 1  . cetirizine (ZYRTEC) 10 MG tablet Take 1 tablet (10 mg total) by mouth daily. 90 tablet 3  . fluticasone (FLONASE) 50 MCG/ACT nasal spray SPRAY 2 SPRAYS INTO EACH NOSTRIL EVERY DAY 16 g 2  . fluticasone (FLOVENT HFA) 44 MCG/ACT inhaler Inhale 1 puff into the lungs 2 (two) times daily. 3 Inhaler 3  . pramipexole (MIRAPEX) 0.25 MG tablet Take 1 tablet (0.25 mg total) by mouth at bedtime. 30 tablet 2   No current facility-administered medications for this visit.     Allergies  Allergen Reactions  . Penicillins Rash    Family History  Problem Relation Age of Onset  . Hyperlipidemia Father   . Diabetes Father   . Cancer Neg Hx   . Heart disease Neg Hx   . Stroke Neg Hx     Social History   Socioeconomic History  . Marital status: Married    Spouse name: Not on file  . Number of children: Not on file  . Years of education: Not on file  . Highest education level: Not on file  Occupational History  . Not on file  Social Needs  . Financial resource strain: Not on file  . Food insecurity:    Worry: Not on file    Inability: Not on file  . Transportation needs:    Medical: Not  on file    Non-medical: Not on file  Tobacco Use  . Smoking status: Former Games developer  . Smokeless tobacco: Never Used  Substance and Sexual Activity  . Alcohol use: Yes    Comment: social  . Drug use: No  . Sexual activity: Yes  Lifestyle  . Physical activity:    Days per week: Not on file    Minutes per session: Not on file  . Stress: Not on file  Relationships  . Social connections:    Talks on phone: Not on file    Gets together: Not on file    Attends religious service: Not on file    Active member of club or organization: Not on file    Attends meetings of clubs or organizations: Not on file    Relationship status: Not on file  . Intimate partner violence:    Fear of current or ex partner: Not on file    Emotionally abused: Not on file    Physically abused: Not on file    Forced sexual activity: Not on file  Other Topics Concern  . Not on file  Social History Narrative  . Not on file     Constitutional: Denies fever, malaise, fatigue, headache or abrupt weight changes.  HEENT: Denies eye pain, eye  redness, ear pain, ringing in the ears, wax buildup, runny nose, nasal congestion, bloody nose, or sore throat. Respiratory: Denies difficulty breathing, shortness of breath, cough or sputum production.   Cardiovascular: Denies chest pain, chest tightness, palpitations or swelling in the hands or feet.  Gastrointestinal: Denies abdominal pain, bloating, constipation, diarrhea or blood in the stool.  GU: Denies urgency, frequency, pain with urination, burning sensation, blood in urine, odor or discharge. Musculoskeletal: Denies decrease in range of motion, difficulty with gait, muscle pain or joint pain and swelling.  Skin: Pt reports painful varicose veins. Denies redness, rashes, lesions or ulcercations.  Neurological: Pt reports restless legs. Denies dizziness, difficulty with memory, difficulty with speech or problems with balance and coordination.  Psych: Denies anxiety,  depression, SI/HI.  No other specific complaints in a complete review of systems (except as listed in HPI above).  Objective:   Physical Exam  BP 130/84   Pulse 81   Temp 98.5 F (36.9 C) (Oral)   Ht 6' (1.829 m)   Wt 198 lb (89.8 kg)   SpO2 98%   BMI 26.85 kg/m  Wt Readings from Last 3 Encounters:  05/29/17 198 lb (89.8 kg)  03/06/17 193 lb (87.5 kg)  05/22/16 181 lb 4 oz (82.2 kg)    General: Appears his stated age, well developed, well nourished in NAD. Skin: Warm, dry and intact. Varicose veins of BLE noted. HEENT: Head: normal shape and size; Eyes: sclera white, no icterus, conjunctiva pink, PERRLA and EOMs intact; Ears: Tm's gray and intact, normal light reflex; Throat/Mouth: Teeth present, mucosa pink and moist, no exudate, lesions or ulcerations noted.  Neck:  Neck supple, trachea midline. No masses, lumps or thyromegaly present.  Cardiovascular: Normal rate and rhythm. S1,S2 noted.  No murmur, rubs or gallops noted. No JVD or BLE edema.  Pulmonary/Chest: Normal effort and positive vesicular breath sounds. No respiratory distress. No wheezes, rales or ronchi noted.  Abdomen: Soft and nontender. Normal bowel sounds. No distention or masses noted. Liver, spleen and kidneys non palpable. Musculoskeletal: Strength 5/5 BUE/BLE. No difficulty with gait.  Neurological: Alert and oriented. Cranial nerves II-XII grossly intact. Coordination normal.  Psychiatric: Mood and affect normal. Behavior is normal. Judgment and thought content normal.     BMET    Component Value Date/Time   NA 138 05/29/2017 1533   NA 141 05/02/2011 2217   K 5.0 05/29/2017 1533   K 3.9 05/02/2011 2217   CL 103 05/29/2017 1533   CL 104 05/02/2011 2217   CO2 25 05/29/2017 1533   CO2 28 05/02/2011 2217   GLUCOSE 89 05/29/2017 1533   GLUCOSE 86 05/02/2011 2217   BUN 14 05/29/2017 1533   BUN 11 05/02/2011 2217   CREATININE 0.71 05/29/2017 1533   CALCIUM 9.7 05/29/2017 1533   CALCIUM 8.4 (L)  05/02/2011 2217   GFRNONAA >60 05/02/2011 2217   GFRAA >60 05/02/2011 2217    Lipid Panel     Component Value Date/Time   CHOL 160 05/29/2017 1533   TRIG 60 05/29/2017 1533   HDL 38 (L) 05/29/2017 1533   CHOLHDL 4.2 05/29/2017 1533   VLDL 9.6 05/22/2016 1457   LDLCALC 107 (H) 05/29/2017 1533    CBC    Component Value Date/Time   WBC 8.3 05/29/2017 1533   RBC 5.01 05/29/2017 1533   HGB 13.7 05/29/2017 1533   HGB 13.1 05/02/2011 2217   HCT 40.8 05/29/2017 1533   HCT 41.3 05/02/2011 2217   PLT 467 (H)  05/29/2017 1533   PLT 505 (H) 05/02/2011 2217   MCV 81.4 05/29/2017 1533   MCV 81 05/02/2011 2217   MCH 27.3 05/29/2017 1533   MCHC 33.6 05/29/2017 1533   RDW 13.8 05/29/2017 1533   RDW 18.8 (H) 05/02/2011 2217    Hgb A1C No results found for: HGBA1C          Assessment & Plan:   Preventative Health Maintenance:  Encouraged him to get a flu shot in the fall Tetanus UTD Encouraged him to consume a balanced diet and exercise regimen Advises him to see an eye doctor and dentist annually Will check CBC, CMET, Lipid, TSH    RTC in 1 year, sooner if needed Nicki Reaper, NP

## 2017-05-30 NOTE — Assessment & Plan Note (Signed)
Controlled on Zyrtec, Flonase, Flovent and Albuterol Will monitor

## 2017-05-30 NOTE — Assessment & Plan Note (Signed)
Will try Mirapex, eRx sent to the pharmacy

## 2017-05-30 NOTE — Assessment & Plan Note (Signed)
Offered refferall to vascular surgery- but he declines at this time Discussed how weight loss may help improve his pain from his varicosities Discussed wearing compression socks

## 2017-06-19 ENCOUNTER — Other Ambulatory Visit: Payer: Self-pay | Admitting: Internal Medicine

## 2017-07-10 ENCOUNTER — Encounter: Payer: Self-pay | Admitting: Internal Medicine

## 2017-07-22 ENCOUNTER — Encounter: Payer: Self-pay | Admitting: Internal Medicine

## 2017-07-25 ENCOUNTER — Encounter: Payer: Self-pay | Admitting: Internal Medicine

## 2017-07-27 ENCOUNTER — Ambulatory Visit: Payer: 59 | Admitting: Internal Medicine

## 2017-07-27 ENCOUNTER — Ambulatory Visit (INDEPENDENT_AMBULATORY_CARE_PROVIDER_SITE_OTHER)
Admission: RE | Admit: 2017-07-27 | Discharge: 2017-07-27 | Disposition: A | Discharge status: Home / Self Care | Payer: 59 | Source: Ambulatory Visit | Attending: Internal Medicine | Admitting: Internal Medicine

## 2017-07-27 ENCOUNTER — Encounter: Payer: Self-pay | Admitting: Internal Medicine

## 2017-07-27 VITALS — BP 126/80 | HR 81 | Temp 98.2°F | Wt 203.0 lb

## 2017-07-27 DIAGNOSIS — M545 Low back pain: Secondary | ICD-10-CM | POA: Diagnosis not present

## 2017-07-27 DIAGNOSIS — M5441 Lumbago with sciatica, right side: Secondary | ICD-10-CM | POA: Diagnosis not present

## 2017-07-27 NOTE — Progress Notes (Signed)
Subjective:    Patient ID: Phillip Gallegos, male    DOB: 05-01-1970, 47 y.o.   MRN: [id number]  HPI  Pt presents to the clinic today to follow up restless legs. At his last visit, we had tried Mirapex which he reports has helped some. He now thinks that this is not restless legs but may be coming from some low back issues. He describes the low back pain as sore, achy and stiff. He has some intermittent numbness in his right thigh, but no tingling or weakness. He denies issues with bowel or bladder. He denies any back injury that he is aware of. He has not taken anything OTC for his symptoms.   Review of Systems      No past medical history on file.  Current Outpatient Medications  Medication Sig Dispense Refill  . albuterol (PROVENTIL HFA;VENTOLIN HFA) 108 (90 Base) MCG/ACT inhaler TAKE 2 PUFFS BY MOUTH EVERY 6 HOURS AS NEEDED FOR WHEEZE OR SHORTNESS OF BREATH 8.5 Inhaler 10  . cetirizine (ZYRTEC) 10 MG tablet Take 1 tablet (10 mg total) by mouth daily. 90 tablet 3  . fluticasone (FLONASE) 50 MCG/ACT nasal spray SPRAY 2 SPRAYS INTO EACH NOSTRIL EVERY DAY 16 g 2  . fluticasone (FLOVENT HFA) 44 MCG/ACT inhaler Inhale 1 puff into the lungs 2 (two) times daily. 3 Inhaler 3  . pramipexole (MIRAPEX) 0.25 MG tablet Take 1 tablet (0.25 mg total) by mouth at bedtime. 30 tablet 2   No current facility-administered medications for this visit.     Allergies  Allergen Reactions  . Penicillins Rash    Family History  Problem Relation Age of Onset  . Hyperlipidemia Father   . Diabetes Father   . Cancer Neg Hx   . Heart disease Neg Hx   . Stroke Neg Hx     Social History   Socioeconomic History  . Marital status: Married    Spouse name: Not on file  . Number of children: Not on file  . Years of education: Not on file  . Highest education level: Not on file  Occupational History  . Not on file  Social Needs  . Financial resource strain: Not on file  . Food insecurity:    Worry:  Not on file    Inability: Not on file  . Transportation needs:    Medical: Not on file    Non-medical: Not on file  Tobacco Use  . Smoking status: Former Games developermoker  . Smokeless tobacco: Never Used  Substance and Sexual Activity  . Alcohol use: Yes    Comment: social  . Drug use: No  . Sexual activity: Yes  Lifestyle  . Physical activity:    Days per week: Not on file    Minutes per session: Not on file  . Stress: Not on file  Relationships  . Social connections:    Talks on phone: Not on file    Gets together: Not on file    Attends religious service: Not on file    Active member of club or organization: Not on file    Attends meetings of clubs or organizations: Not on file    Relationship status: Not on file  . Intimate partner violence:    Fear of current or ex partner: Not on file    Emotionally abused: Not on file    Physically abused: Not on file    Forced sexual activity: Not on file  Other Topics Concern  . Not on  file  Social History Narrative  . Not on file     Constitutional: Denies fever, malaise, fatigue, headache or abrupt weight changes.  Gastrointestinal: Denies abdominal pain, bloating, constipation, diarrhea or blood in the stool.  GU: Denies urgency, frequency, pain with urination, burning sensation, blood in urine, odor or discharge. Musculoskeletal: Pt reports low back pain. Denies decrease in range of motion, difficulty with gait, muscle pain or joint swelling.  Skin: Denies redness, rashes, lesions or ulcercations.  Neurological: Pt reports numbness of right thigh. Denies dizziness, difficulty with memory, difficulty with speech or problems with balance and coordination.    No other specific complaints in a complete review of systems (except as listed in HPI above).  Objective:   Physical Exam   BP 126/80   Pulse 81   Temp 98.2 F (36.8 C) (Oral)   Wt 203 lb (92.1 kg)   SpO2 98%   BMI 27.53 kg/m  Wt Readings from Last 3 Encounters:    07/27/17 203 lb (92.1 kg)  05/29/17 198 lb (89.8 kg)  03/06/17 193 lb (87.5 kg)    General: Appears his stated age, well developed, well nourished in NAD. Skin: Warm, dry and intact.  Musculoskeletal: Normal flexion, extension, rotation and lateral bending of the spine. Bony tenderness noted over the lumbar spine. Strength 5/5 BUE/BLE. No difficulty with gait.  Neurological: Alert and oriented. Negative SLR on the right.   BMET    Component Value Date/Time   NA 138 05/29/2017 1533   NA 141 05/02/2011 2217   K 5.0 05/29/2017 1533   K 3.9 05/02/2011 2217   CL 103 05/29/2017 1533   CL 104 05/02/2011 2217   CO2 25 05/29/2017 1533   CO2 28 05/02/2011 2217   GLUCOSE 89 05/29/2017 1533   GLUCOSE 86 05/02/2011 2217   BUN 14 05/29/2017 1533   BUN 11 05/02/2011 2217   CREATININE 0.71 05/29/2017 1533   CALCIUM 9.7 05/29/2017 1533   CALCIUM 8.4 (L) 05/02/2011 2217   GFRNONAA >60 05/02/2011 2217   GFRAA >60 05/02/2011 2217    Lipid Panel     Component Value Date/Time   CHOL 160 05/29/2017 1533   TRIG 60 05/29/2017 1533   HDL 38 (L) 05/29/2017 1533   CHOLHDL 4.2 05/29/2017 1533   VLDL 9.6 05/22/2016 1457   LDLCALC 107 (H) 05/29/2017 1533    CBC    Component Value Date/Time   WBC 8.3 05/29/2017 1533   RBC 5.01 05/29/2017 1533   HGB 13.7 05/29/2017 1533   HGB 13.1 05/02/2011 2217   HCT 40.8 05/29/2017 1533   HCT 41.3 05/02/2011 2217   PLT 467 (H) 05/29/2017 1533   PLT 505 (H) 05/02/2011 2217   MCV 81.4 05/29/2017 1533   MCV 81 05/02/2011 2217   MCH 27.3 05/29/2017 1533   MCHC 33.6 05/29/2017 1533   RDW 13.8 05/29/2017 1533   RDW 18.8 (H) 05/02/2011 2217    Hgb A1C No results found for: HGBA1C         Assessment & Plan:   Acute Low Back Pain with Left Side Sciatica:  Xray lumbar spine today Discussed stretching, NSAID's If persists or worsens, consider PT, may eventually need MRI  Return precautions discussed Nicki Reaper, NP

## 2017-07-27 NOTE — Patient Instructions (Signed)
Sciatica Rehab  Ask your health care provider which exercises are safe for you. Do exercises exactly as told by your health care provider and adjust them as directed. It is normal to feel mild stretching, pulling, tightness, or discomfort as you do these exercises, but you should stop right away if you feel sudden pain or your pain gets worse. Do not begin these exercises until told by your health care provider.  Stretching and range of motion exercises  These exercises warm up your muscles and joints and improve the movement and flexibility of your hips and your back. These exercises also help to relieve pain, numbness, and tingling.  Exercise A: Sciatic nerve glide  1. Sit in a chair with your head facing down toward your chest. Place your hands behind your back. Let your shoulders slump forward.  2. Slowly straighten one of your knees while you tilt your head back as if you are looking toward the ceiling. Only straighten your leg as far as you can without making your symptoms worse.  3. Hold for __________ seconds.  4. Slowly return to the starting position.  5. Repeat with your other leg.  Repeat __________ times. Complete this exercise __________ times a day.  Exercise B: Knee to chest with hip adduction and internal rotation    1. Lie on your back on a firm surface with both legs straight.  2. Bend one of your knees and move it up toward your chest until you feel a gentle stretch in your lower back and buttock. Then, move your knee toward the shoulder that is on the opposite side from your leg.  ? Hold your leg in this position by holding onto the front of your knee.  3. Hold for __________ seconds.  4. Slowly return to the starting position.  5. Repeat with your other leg.  Repeat __________ times. Complete this exercise __________ times a day.  Exercise C: Prone extension on elbows    1. Lie on your abdomen on a firm surface. A bed may be too soft for this exercise.  2. Prop yourself up on your  elbows.  3. Use your arms to help lift your chest up until you feel a gentle stretch in your abdomen and your lower back.  ? This will place some of your body weight on your elbows. If this is uncomfortable, try stacking pillows under your chest.  ? Your hips should stay down, against the surface that you are lying on. Keep your hip and back muscles relaxed.  4. Hold for __________ seconds.  5. Slowly relax your upper body and return to the starting position.  Repeat __________ times. Complete this exercise __________ times a day.  Strengthening exercises  These exercises build strength and endurance in your back. Endurance is the ability to use your muscles for a long time, even after they get tired.  Exercise D: Pelvic tilt  1. Lie on your back on a firm surface. Bend your knees and keep your feet flat.  2. Tense your abdominal muscles. Tip your pelvis up toward the ceiling and flatten your lower back into the floor.  ? To help with this exercise, you may place a small towel under your lower back and try to push your back into the towel.  3. Hold for __________ seconds.  4. Let your muscles relax completely before you repeat this exercise.  Repeat __________ times. Complete this exercise __________ times a day.  Exercise E: Alternating arm and leg raises      1. Get on your hands and knees on a firm surface. If you are on a hard floor, you may want to use padding to cushion your knees, such as an exercise mat.  2. Line up your arms and legs. Your hands should be below your shoulders, and your knees should be below your hips.  3. Lift your left leg behind you. At the same time, raise your right arm and straighten it in front of you.  ? Do not lift your leg higher than your hip.  ? Do not lift your arm higher than your shoulder.  ? Keep your abdominal and back muscles tight.  ? Keep your hips facing the ground.  ? Do not arch your back.  ? Keep your balance carefully, and do not hold your breath.  4. Hold for  __________ seconds.  5. Slowly return to the starting position and repeat with your right leg and your left arm.  Repeat __________ times. Complete this exercise __________ times a day.  Posture and body mechanics    Body mechanics refers to the movements and positions of your body while you do your daily activities. Posture is part of body mechanics. Good posture and healthy body mechanics can help to relieve stress in your body's tissues and joints. Good posture means that your spine is in its natural S-curve position (your spine is neutral), your shoulders are pulled back slightly, and your head is not tipped forward. The following are general guidelines for applying improved posture and body mechanics to your everyday activities.  Standing    · When standing, keep your spine neutral and your feet about hip-width apart. Keep a slight bend in your knees. Your ears, shoulders, and hips should line up.  · When you do a task in which you stand in one place for a long time, place one foot up on a stable object that is 2-4 inches (5-10 cm) high, such as a footstool. This helps keep your spine neutral.  Sitting    · When sitting, keep your spine neutral and keep your feet flat on the floor. Use a footrest, if necessary, and keep your thighs parallel to the floor. Avoid rounding your shoulders, and avoid tilting your head forward.  · When working at a desk or a computer, keep your desk at a height where your hands are slightly lower than your elbows. Slide your chair under your desk so you are close enough to maintain good posture.  · When working at a computer, place your monitor at a height where you are looking straight ahead and you do not have to tilt your head forward or downward to look at the screen.  Resting    · When lying down and resting, avoid positions that are most painful for you.  · If you have pain with activities such as sitting, bending, stooping, or squatting (flexion-based activities), lie in a  position in which your body does not bend very much. For example, avoid curling up on your side with your arms and knees near your chest (fetal position).  · If you have pain with activities such as standing for a long time or reaching with your arms (extension-based activities), lie with your spine in a neutral position and bend your knees slightly. Try the following positions:  ? Lying on your side with a pillow between your knees.  ? Lying on your back with a pillow under your knees.  Lifting    · When lifting   objects, keep your feet at least shoulder-width apart and tighten your abdominal muscles.  · Bend your knees and hips and keep your spine neutral. It is important to lift using the strength of your legs, not your back. Do not lock your knees straight out.  · Always ask for help to lift heavy or awkward objects.  This information is not intended to replace advice given to you by your health care provider. Make sure you discuss any questions you have with your health care provider.  Document Released: 01/06/2005 Document Revised: 09/13/2015 Document Reviewed: 09/22/2014  Elsevier Interactive Patient Education © 2018 [employer]

## 2017-08-16 ENCOUNTER — Other Ambulatory Visit: Payer: Self-pay | Admitting: Internal Medicine

## 2017-08-20 ENCOUNTER — Encounter: Payer: Self-pay | Admitting: Internal Medicine

## 2017-10-13 ENCOUNTER — Other Ambulatory Visit: Payer: Self-pay | Admitting: Internal Medicine

## 2017-11-19 ENCOUNTER — Telehealth: Payer: Self-pay | Admitting: Internal Medicine

## 2017-11-19 NOTE — Telephone Encounter (Signed)
Phillip Gallegos with [facility] called on behalf of pt.  Relationship to patient:pharmacist tech Best [phone] Pharmacy:[facility] Fax: [phone]  Reason for call:Refill Cetirizine HCL 10mg 

## 2017-11-20 MED ORDER — CETIRIZINE HCL 10 MG PO TABS: 10.0000 mg | ORAL_TABLET | Freq: Every day | ORAL | 1 refills | Status: DC

## 2017-11-20 NOTE — Telephone Encounter (Signed)
Rx sent through [facility]  

## 2017-11-20 NOTE — Addendum Note (Signed)
Addended by: Roena Malady on: 11/20/2017 02:38 PM   Modules accepted: Orders

## 2017-12-28 DIAGNOSIS — J069 Acute upper respiratory infection, unspecified: Secondary | ICD-10-CM | POA: Diagnosis not present

## 2018-01-01 DIAGNOSIS — J22 Unspecified acute lower respiratory infection: Secondary | ICD-10-CM | POA: Diagnosis not present

## 2018-01-01 DIAGNOSIS — A499 Bacterial infection, unspecified: Secondary | ICD-10-CM | POA: Diagnosis not present

## 2018-01-29 ENCOUNTER — Other Ambulatory Visit: Payer: Self-pay | Admitting: Internal Medicine

## 2018-02-16 ENCOUNTER — Encounter: Payer: Self-pay | Admitting: Internal Medicine

## 2018-02-16 DIAGNOSIS — G2581 Restless legs syndrome: Secondary | ICD-10-CM

## 2018-02-17 MED ORDER — PRAMIPEXOLE DIHYDROCHLORIDE 0.25 MG PO TABS: 0.2500 mg | ORAL_TABLET | Freq: Every day | ORAL | 1 refills | Status: DC

## 2018-02-17 NOTE — Telephone Encounter (Signed)
This was last filled 05/2017 with 2 refills... please advise

## 2018-03-24 ENCOUNTER — Other Ambulatory Visit: Payer: Self-pay | Admitting: Internal Medicine

## 2018-05-23 ENCOUNTER — Other Ambulatory Visit: Payer: Self-pay | Admitting: Internal Medicine

## 2018-06-22 ENCOUNTER — Other Ambulatory Visit: Payer: Self-pay | Admitting: Internal Medicine

## 2018-07-22 ENCOUNTER — Other Ambulatory Visit: Payer: Self-pay | Admitting: Internal Medicine

## 2018-07-22 DIAGNOSIS — G2581 Restless legs syndrome: Secondary | ICD-10-CM

## 2018-08-16 ENCOUNTER — Other Ambulatory Visit: Payer: Self-pay | Admitting: Internal Medicine

## 2018-09-13 ENCOUNTER — Encounter: Payer: Self-pay | Admitting: Internal Medicine

## 2018-10-15 ENCOUNTER — Other Ambulatory Visit: Payer: Self-pay | Admitting: Internal Medicine

## 2018-10-15 DIAGNOSIS — G2581 Restless legs syndrome: Secondary | ICD-10-CM

## 2018-10-18 ENCOUNTER — Other Ambulatory Visit: Payer: Self-pay | Admitting: Internal Medicine

## 2018-10-19 ENCOUNTER — Encounter: Payer: Self-pay | Admitting: Internal Medicine

## 2018-12-20 ENCOUNTER — Other Ambulatory Visit: Payer: Self-pay

## 2018-12-20 ENCOUNTER — Encounter: Payer: Self-pay | Admitting: Internal Medicine

## 2018-12-20 ENCOUNTER — Encounter

## 2018-12-20 ENCOUNTER — Ambulatory Visit (INDEPENDENT_AMBULATORY_CARE_PROVIDER_SITE_OTHER): Payer: 59 | Admitting: Internal Medicine

## 2018-12-20 VITALS — BP 128/82 | HR 92 | Temp 98.4°F | Ht 70.5 in | Wt 195.0 lb

## 2018-12-20 DIAGNOSIS — Z0000 Encounter for general adult medical examination without abnormal findings: Secondary | ICD-10-CM

## 2018-12-20 DIAGNOSIS — G2581 Restless legs syndrome: Secondary | ICD-10-CM | POA: Diagnosis not present

## 2018-12-20 DIAGNOSIS — I83813 Varicose veins of bilateral lower extremities with pain: Secondary | ICD-10-CM | POA: Diagnosis not present

## 2018-12-20 DIAGNOSIS — J4520 Mild intermittent asthma, uncomplicated: Secondary | ICD-10-CM

## 2018-12-20 LAB — COMPREHENSIVE METABOLIC PANEL
ALT: 29 U/L (ref 0–53)
AST: 23 U/L (ref 0–37)
Albumin: 4.2 g/dL (ref 3.5–5.2)
Alkaline Phosphatase: 76 U/L (ref 39–117)
BUN: 14 mg/dL (ref 6–23)
CO2: 28 mEq/L (ref 19–32)
Calcium: 9.5 mg/dL (ref 8.4–10.5)
Chloride: 105 mEq/L (ref 96–112)
Creatinine, Ser: 0.76 mg/dL (ref 0.40–1.50)
GFR: 110.17 mL/min (ref >60.00)
Glucose, Bld: 86 mg/dL (ref 70–99)
Potassium: 5 mEq/L (ref 3.5–5.1)
Sodium: 138 mEq/L (ref 135–145)
Total Bilirubin: 0.6 mg/dL (ref 0.2–1.2)
Total Protein: 7.2 g/dL (ref 6.0–8.3)

## 2018-12-20 LAB — MAGNESIUM: Magnesium: 1.8 mg/dL (ref 1.5–2.5)

## 2018-12-20 LAB — LIPID PANEL
Cholesterol: 165 mg/dL (ref 0–200)
HDL: 39.9 mg/dL (ref >39.00)
LDL Cholesterol: 114 mg/dL — ABNORMAL HIGH (ref 0–99)
NonHDL: 125.16
Total CHOL/HDL Ratio: 4
Triglycerides: 56 mg/dL (ref 0.0–149.0)
VLDL: 11.2 mg/dL (ref 0.0–40.0)

## 2018-12-20 LAB — CBC
HCT: 41.5 % (ref 39.0–52.0)
Hemoglobin: 13.8 g/dL (ref 13.0–17.0)
MCHC: 33.2 g/dL (ref 30.0–36.0)
MCV: 86.9 fl (ref 78.0–100.0)
Platelets: 419 10*3/uL — ABNORMAL HIGH (ref 150.0–400.0)
RBC: 4.78 Mil/uL (ref 4.22–5.81)
RDW: 15.5 % (ref 11.5–15.5)
WBC: 9.3 10*3/uL (ref 4.0–10.5)

## 2018-12-20 NOTE — Assessment & Plan Note (Addendum)
Managed on Mirapex, continue as prescribed Will check magnesium level Will monitor

## 2018-12-20 NOTE — Patient Instructions (Signed)

## 2018-12-20 NOTE — Assessment & Plan Note (Addendum)
Managed on Flovent, continue as prescribed Use Albuterol PRN  Will monitor

## 2018-12-20 NOTE — Assessment & Plan Note (Addendum)
Continue to use compression stockings  Will monitor

## 2018-12-20 NOTE — Progress Notes (Addendum)
Subjective:    Patient ID: Phillip Gallegos, male    DOB: 08/20/1970, 48 y.o.   MRN: [id number]  HPI  Pt presents to the clinic today for his annual exam. He is also due to follow up chronic conditions.  Asthma: Triggered by physical exercise and cold weather. He is taking Flovent as prescribed. He has not used Albuterol in over 9 months. No PFT's available . He does not follow with pulmonology.  Hx of Varicose Veins: He uses compression stockings. He reports they are still visible but not painful or inflamed.   RLS: Managed on Mirapex with good relief of symptoms  Flu: 01/2018 Tetanus: 05/2016 Colon Screening: never Vision Screening: annually Dentist: biannually  Diet: He eats meats. He consumes fruits and veggies daily. He limits fried foods. He drinks mostly water and herbalife supplement tea.  Exercise: He walks in the evenings 20-30 minutes daily. (2-3 miles)  Review of Systems      No past medical history on file.  Current Outpatient Medications  Medication Sig Dispense Refill  . albuterol (PROVENTIL HFA;VENTOLIN HFA) 108 (90 Base) MCG/ACT inhaler TAKE 2 PUFFS BY MOUTH EVERY 6 HOURS AS NEEDED FOR WHEEZE OR SHORTNESS OF BREATH 8.5 Inhaler 10  . cetirizine (ZYRTEC) 10 MG tablet Take 1 tablet (10 mg total) by mouth daily. LAST REFILL, MUST SCHEDULE PHYSICAL 90 tablet 0  . FLOVENT HFA 44 MCG/ACT inhaler Inhale 1 puff by mouth twice daily. 31.8 g 0  . fluticasone (FLONASE) 50 MCG/ACT nasal spray Place 2 sprays into both nostrils daily. LAST REFILL SCHEDULE PHYSICAL EXAM 48 g 0  . pramipexole (MIRAPEX) 0.25 MG tablet Take 1 tablet (0.25 mg total) by mouth at bedtime. LAST REFILL SCHEDULE PHYSICAL 90 tablet 0   No current facility-administered medications for this visit.     Allergies  Allergen Reactions  . Penicillins Rash    Family History  Problem Relation Age of Onset  . Hyperlipidemia Father   . Diabetes Father   . Cancer Neg Hx   . Heart disease Neg Hx   .  Stroke Neg Hx     Social History   Socioeconomic History  . Marital status: Married    Spouse name: Not on file  . Number of children: Not on file  . Years of education: Not on file  . Highest education level: Not on file  Occupational History  . Not on file  Social Needs  . Financial resource strain: Not on file  . Food insecurity    Worry: Not on file    Inability: Not on file  . Transportation needs    Medical: Not on file    Non-medical: Not on file  Tobacco Use  . Smoking status: Former Research scientist (life sciences)  . Smokeless tobacco: Never Used  Substance and Sexual Activity  . Alcohol use: Yes    Comment: social  . Drug use: No  . Sexual activity: Yes  Lifestyle  . Physical activity    Days per week: Not on file    Minutes per session: Not on file  . Stress: Not on file  Relationships  . Social Herbalist on phone: Not on file    Gets together: Not on file    Attends religious service: Not on file    Active member of club or organization: Not on file    Attends meetings of clubs or organizations: Not on file    Relationship status: Not on file  . Intimate partner  violence    Fear of current or ex partner: Not on file    Emotionally abused: Not on file    Physically abused: Not on file    Forced sexual activity: Not on file  Other Topics Concern  . Not on file  Social History Narrative  . Not on file     Constitutional: Denies fever, malaise, fatigue, headache or abrupt weight changes.  HEENT: Denies eye pain, eye redness, ear pain, ringing in the ears, wax buildup, runny nose, nasal congestion, bloody nose, or sore throat. Respiratory: Pt reports intermittent cough. Denies difficulty breathing, shortness of breath, or sputum production.   Cardiovascular: Pt has history of varicose veins. Denies chest pain, chest tightness, palpitations or swelling in the hands or feet.  Gastrointestinal: Denies abdominal pain, bloating, constipation, diarrhea or blood in the  stool.  GU: Denies urgency, frequency, pain with urination, burning sensation, blood in urine, odor or discharge. Musculoskeletal: Denies decrease in range of motion, difficulty with gait, muscle pain or joint pain and swelling.  Skin: Denies redness, rashes, lesions or ulcercations.  Neurological: Pt reports restless legs. Denies dizziness, difficulty with memory, difficulty with speech or problems with balance and coordination.  Psych: Denies anxiety, depression, SI/HI.  No other specific complaints in a complete review of systems (except as listed in HPI above).  Objective:   Physical Exam  BP 128/82   Pulse 92   Temp 98.4 F (36.9 C) (Temporal)   Ht 5' 10.5" (1.791 m)   Wt 195 lb (88.5 kg)   SpO2 98%   BMI 27.58 kg/m   Wt Readings from Last 3 Encounters:  07/27/17 203 lb (92.1 kg)  05/29/17 198 lb (89.8 kg)  03/06/17 193 lb (87.5 kg)    General: Appears his stated age, well developed, well nourished in NAD. Skin: Warm, dry and intact. No rashes noted. HEENT: Head: normal shape and size; Eyes: sclera white, no icterus, conjunctiva pink and EOMs intact Neck:  Neck supple, trachea midline. No masses, lumps or thyromegaly present.  Cardiovascular: Normal rate and rhythm. S1,S2 noted.  No murmur, rubs or gallops noted. No JVD or BLE edema.  Pulmonary/Chest: Normal effort and positive vesicular breath sounds. No respiratory distress. No wheezes, rales or ronchi noted.  Abdomen: Soft and nontender. Normal bowel sounds. No distention or masses noted. Liver, spleen and kidneys non palpable. Musculoskeletal: Strength 5/5 BUE/BLE. No difficulty with gait.  Neurological: Alert and oriented. Cranial nerves II-XII grossly intact. Coordination normal.  Psychiatric: Mood and affect normal. Behavior is normal. Judgment and thought content normal.    BMET    Component Value Date/Time   NA 138 05/29/2017 1533   NA 141 05/02/2011 2217   K 5.0 05/29/2017 1533   K 3.9 05/02/2011 2217    CL 103 05/29/2017 1533   CL 104 05/02/2011 2217   CO2 25 05/29/2017 1533   CO2 28 05/02/2011 2217   GLUCOSE 89 05/29/2017 1533   GLUCOSE 86 05/02/2011 2217   BUN 14 05/29/2017 1533   BUN 11 05/02/2011 2217   CREATININE 0.71 05/29/2017 1533   CALCIUM 9.7 05/29/2017 1533   CALCIUM 8.4 (L) 05/02/2011 2217   GFRNONAA >60 05/02/2011 2217   GFRAA >60 05/02/2011 2217    Lipid Panel     Component Value Date/Time   CHOL 160 05/29/2017 1533   TRIG 60 05/29/2017 1533   HDL 38 (L) 05/29/2017 1533   CHOLHDL 4.2 05/29/2017 1533   VLDL 9.6 05/22/2016 1457   LDLCALC 107 (  H) 05/29/2017 1533    CBC    Component Value Date/Time   WBC 8.3 05/29/2017 1533   RBC 5.01 05/29/2017 1533   HGB 13.7 05/29/2017 1533   HGB 13.1 05/02/2011 2217   HCT 40.8 05/29/2017 1533   HCT 41.3 05/02/2011 2217   PLT 467 (H) 05/29/2017 1533   PLT 505 (H) 05/02/2011 2217   MCV 81.4 05/29/2017 1533   MCV 81 05/02/2011 2217   MCH 27.3 05/29/2017 1533   MCHC 33.6 05/29/2017 1533   RDW 13.8 05/29/2017 1533   RDW 18.8 (H) 05/02/2011 2217    Hgb A1C No results found for: HGBA1C        Assessment & Plan:  Preventative Health Maintenance:  Flu shot today Tetanus UTD He declines referral for colonoscopy, IFOB or Cologuard Encouraged him to consume a balanced diet and exercise regimen Encouraged to see a dentist and vision checked annually Will obtain CBC, CMET, Lipid profile and Magnesium  RTC in 1 year, sooner if needed Nicki Reaperegina Bernetha Anschutz, NP This visit occurred during the SARS-CoV-2 public health emergency.  Safety protocols were in place, including screening questions prior to the visit, additional usage of staff PPE, and extensive cleaning of exam room while observing appropriate contact time as indicated for disinfecting solutions.

## 2018-12-30 ENCOUNTER — Encounter: Payer: Self-pay | Admitting: Internal Medicine

## 2018-12-31 MED ORDER — ALBUTEROL SULFATE HFA 108 (90 BASE) MCG/ACT IN AERS: INHALATION_SPRAY | RESPIRATORY_TRACT | 10 refills | Status: DC

## 2019-03-23 ENCOUNTER — Encounter: Payer: Self-pay | Admitting: Internal Medicine

## 2019-03-23 MED ORDER — FLOVENT HFA 44 MCG/ACT IN AERO: 1.0000 | INHALATION_SPRAY | Freq: Two times a day (BID) | RESPIRATORY_TRACT | 1 refills | Status: DC

## 2020-01-04 ENCOUNTER — Encounter: Payer: Self-pay | Admitting: Internal Medicine

## 2020-01-04 ENCOUNTER — Ambulatory Visit (INDEPENDENT_AMBULATORY_CARE_PROVIDER_SITE_OTHER): Payer: 59 | Admitting: Internal Medicine

## 2020-01-04 ENCOUNTER — Other Ambulatory Visit: Payer: Self-pay

## 2020-01-04 VITALS — BP 124/84 | HR 88 | Temp 97.8°F | Wt 214.0 lb

## 2020-01-04 DIAGNOSIS — Z23 Encounter for immunization: Secondary | ICD-10-CM | POA: Diagnosis not present

## 2020-01-04 DIAGNOSIS — H6123 Impacted cerumen, bilateral: Secondary | ICD-10-CM

## 2020-01-04 NOTE — Patient Instructions (Signed)
Earwax Buildup, Adult The ears produce a substance called earwax that helps keep bacteria out of the ear and protects the skin in the ear canal. Occasionally, earwax can build up in the ear and cause discomfort or hearing loss. What increases the risk? This condition is more likely to develop in people who:  Are male.  Are elderly.  Naturally produce more earwax.  Clean their ears often with cotton swabs.  Use earplugs often.  Use in-ear headphones often.  Wear hearing aids.  Have narrow ear canals.  Have earwax that is overly thick or sticky.  Have eczema.  Are dehydrated.  Have excess hair in the ear canal. What are the signs or symptoms? Symptoms of this condition include:  Reduced or muffled hearing.  A feeling of fullness in the ear or feeling that the ear is plugged.  Fluid coming from the ear.  Ear pain.  Ear itch.  Ringing in the ear.  Coughing.  An obvious piece of earwax that can be seen inside the ear canal. How is this diagnosed? This condition may be diagnosed based on:  Your symptoms.  Your medical history.  An ear exam. During the exam, your health care provider will look into your ear with an instrument called an otoscope. You may have tests, including a hearing test. How is this treated? This condition may be treated by:  Using ear drops to soften the earwax.  Having the earwax removed by a health care provider. The health care provider may: ? Flush the ear with water. ? Use an instrument that has a loop on the end (curette). ? Use a suction device.  Surgery to remove the wax buildup. This may be done in severe cases. Follow these instructions at home:   Take over-the-counter and prescription medicines only as told by your health care provider.  Do not put any objects, including cotton swabs, into your ear. You can clean the opening of your ear canal with a washcloth or facial tissue.  Follow instructions from your health care  provider about cleaning your ears. Do not over-clean your ears.  Drink enough fluid to keep your urine clear or pale yellow. This will help to thin the earwax.  Keep all follow-up visits as told by your health care provider. If earwax builds up in your ears often or if you use hearing aids, consider seeing your health care provider for routine, preventive ear cleanings. Ask your health care provider how often you should schedule your cleanings.  If you have hearing aids, clean them according to instructions from the manufacturer and your health care provider. Contact a health care provider if:  You have ear pain.  You develop a fever.  You have blood, pus, or other fluid coming from your ear.  You have hearing loss.  You have ringing in your ears that does not go away.  Your symptoms do not improve with treatment.  You feel like the room is spinning (vertigo). Summary  Earwax can build up in the ear and cause discomfort or hearing loss.  The most common symptoms of this condition include reduced or muffled hearing and a feeling of fullness in the ear or feeling that the ear is plugged.  This condition may be diagnosed based on your symptoms, your medical history, and an ear exam.  This condition may be treated by using ear drops to soften the earwax or by having the earwax removed by a health care provider.  Do not put any   objects, including cotton swabs, into your ear. You can clean the opening of your ear canal with a washcloth or facial tissue. This information is not intended to replace advice given to you by your health care provider. Make sure you discuss any questions you have with your health care provider. Document Revised: 12/19/2016 Document Reviewed: 03/19/2016 Elsevier Patient Education  2020 Elsevier Inc.  

## 2020-01-04 NOTE — Progress Notes (Signed)
Subjective:    Patient ID: Phillip Gallegos, male    DOB: 10-28-1970, 49 y.o.   MRN: [id number]  HPI  Pt presents to the clinic today with c/o bilateral ear pain. This started 4-5 days ago. He reports the left is worse than the right. He reports associated pressure. He reports his symptoms have improved slightly. He denies headache, runny nose, nasal congestion, sore throat, loss of taste/smell, cough or SOB. He denies fever, chills or body aches. He has tried [facility] and ear wax removal kit.   Review of Systems      Past Medical History:  Diagnosis Date  . Melanoma in situ of left upper arm ([place])     Current Outpatient Medications  Medication Sig Dispense Refill  . albuterol (VENTOLIN HFA) 108 (90 Base) MCG/ACT inhaler TAKE 2 PUFFS BY MOUTH EVERY 6 HOURS AS NEEDED FOR WHEEZE OR SHORTNESS OF BREATH 8 g 10  . cetirizine (ZYRTEC) 10 MG tablet Take 1 tablet (10 mg total) by mouth daily. LAST REFILL, MUST SCHEDULE PHYSICAL 90 tablet 0  . fluticasone (FLONASE) 50 MCG/ACT nasal spray Place 2 sprays into both nostrils daily. LAST REFILL SCHEDULE PHYSICAL EXAM 48 g 0  . fluticasone (FLOVENT HFA) 44 MCG/ACT inhaler Inhale 1 puff into the lungs 2 (two) times daily. 31.8 g 1  . pramipexole (MIRAPEX) 0.25 MG tablet Take 1 tablet (0.25 mg total) by mouth at bedtime. LAST REFILL SCHEDULE PHYSICAL 90 tablet 0   No current facility-administered medications for this visit.    Allergies  Allergen Reactions  . Penicillins Rash    Family History  Problem Relation Age of Onset  . Hyperlipidemia Father   . Diabetes Father   . Cancer Neg Hx   . Heart disease Neg Hx   . Stroke Neg Hx     Social History   Socioeconomic History  . Marital status: Married    Spouse name: Not on file  . Number of children: Not on file  . Years of education: Not on file  . Highest education level: Not on file  Occupational History  . Not on file  Tobacco Use  . Smoking status: Former Research scientist (life sciences)  . Smokeless  tobacco: Never Used  Substance and Sexual Activity  . Alcohol use: Yes    Comment: social  . Drug use: No  . Sexual activity: Yes  Other Topics Concern  . Not on file  Social History Narrative  . Not on file   Social Determinants of Health   Financial Resource Strain: Not on file  Food Insecurity: Not on file  Transportation Needs: Not on file  Physical Activity: Not on file  Stress: Not on file  Social Connections: Not on file  Intimate Partner Violence: Not on file     Constitutional: Denies fever, malaise, fatigue, headache or abrupt weight changes.  HEENT: Pt reports bilateral ear pain. Denies eye pain, eye redness, ringing in the ears, wax buildup, runny nose, nasal congestion, bloody nose, or sore throat. Respiratory: Denies difficulty breathing, shortness of breath, cough or sputum production.   Cardiovascular: Denies chest pain, chest tightness, palpitations or swelling in the hands or feet.    No other specific complaints in a complete review of systems (except as listed in HPI above).  Objective:   Physical Exam    BP 124/84   Pulse 88   Temp 97.8 F (36.6 C) (Temporal)   Wt 214 lb (97.1 kg)   SpO2 98%   BMI 30.27 kg/m  Wt Readings from Last 3 Encounters:  01/04/20 214 lb (97.1 kg)  12/20/18 195 lb (88.5 kg)  07/27/17 203 lb (92.1 kg)    General: Appears his stated age, well developed, well nourished in NAD. HEENT: Head: normal shape and size; Ears: bilateral cerumen impaction;  Cardiovascular: Normal rate. Pulmonary/Chest: Normal effort.d.  Neurological: Alert and oriented.   BMET    Component Value Date/Time   NA 138 12/20/2018 1253   NA 141 05/02/2011 2217   K 5.0 12/20/2018 1253   K 3.9 05/02/2011 2217   CL 105 12/20/2018 1253   CL 104 05/02/2011 2217   CO2 28 12/20/2018 1253   CO2 28 05/02/2011 2217   GLUCOSE 86 12/20/2018 1253   GLUCOSE 86 05/02/2011 2217   BUN 14 12/20/2018 1253   BUN 11 05/02/2011 2217   CREATININE 0.76  12/20/2018 1253   CREATININE 0.71 05/29/2017 1533   CALCIUM 9.5 12/20/2018 1253   CALCIUM 8.4 (L) 05/02/2011 2217   GFRNONAA >60 05/02/2011 2217   GFRAA >60 05/02/2011 2217    Lipid Panel     Component Value Date/Time   CHOL 165 12/20/2018 1253   TRIG 56.0 12/20/2018 1253   HDL 39.90 12/20/2018 1253   CHOLHDL 4 12/20/2018 1253   VLDL 11.2 12/20/2018 1253   LDLCALC 114 (H) 12/20/2018 1253   LDLCALC 107 (H) 05/29/2017 1533    CBC    Component Value Date/Time   WBC 9.3 12/20/2018 1253   RBC 4.78 12/20/2018 1253   HGB 13.8 12/20/2018 1253   HGB 13.1 05/02/2011 2217   HCT 41.5 12/20/2018 1253   HCT 41.3 05/02/2011 2217   PLT 419.0 (H) 12/20/2018 1253   PLT 505 (H) 05/02/2011 2217   MCV 86.9 12/20/2018 1253   MCV 81 05/02/2011 2217   MCH 27.3 05/29/2017 1533   MCHC 33.2 12/20/2018 1253   RDW 15.5 12/20/2018 1253   RDW 18.8 (H) 05/02/2011 2217    Hgb A1C No results found for: HGBA1C        Assessment & Plan:   Bilateral Cerumen Impaction:  Discussed risks vs benefits of wax removal Informed consent obtained verbally Manaual lavage by CMA - unsuccessful Attempted to remove cerumen impaction by this provider with cerumen spoon, was able to open up but plug extends to the TM  Can try Debrox 2 x week to prevent wax buildup  Return precautions discussed Webb Silversmith, NP This visit occurred during the SARS-CoV-2 public health emergency.  Safety protocols were in place, including screening questions prior to the visit, additional usage of staff PPE, and extensive cleaning of exam room while observing appropriate contact time as indicated for disinfecting solutions.

## 2020-01-23 ENCOUNTER — Encounter: Payer: Self-pay | Admitting: Internal Medicine

## 2020-01-23 MED ORDER — ALBUTEROL SULFATE HFA 108 (90 BASE) MCG/ACT IN AERS: INHALATION_SPRAY | RESPIRATORY_TRACT | 0 refills | Status: DC

## 2020-01-23 NOTE — Addendum Note (Signed)
Addended by: Roena Malady on: 01/23/2020 10:12 AM   Modules accepted: Orders

## 2020-02-14 ENCOUNTER — Encounter: Payer: Self-pay | Admitting: Internal Medicine

## 2020-02-14 ENCOUNTER — Other Ambulatory Visit: Payer: Self-pay

## 2020-02-14 ENCOUNTER — Ambulatory Visit (INDEPENDENT_AMBULATORY_CARE_PROVIDER_SITE_OTHER): Payer: 59 | Admitting: Internal Medicine

## 2020-02-14 VITALS — BP 124/82 | HR 83 | Temp 97.5°F | Ht 70.5 in | Wt 216.0 lb

## 2020-02-14 DIAGNOSIS — G2581 Restless legs syndrome: Secondary | ICD-10-CM | POA: Diagnosis not present

## 2020-02-14 DIAGNOSIS — I83813 Varicose veins of bilateral lower extremities with pain: Secondary | ICD-10-CM | POA: Diagnosis not present

## 2020-02-14 DIAGNOSIS — Z0001 Encounter for general adult medical examination with abnormal findings: Secondary | ICD-10-CM

## 2020-02-14 DIAGNOSIS — J4520 Mild intermittent asthma, uncomplicated: Secondary | ICD-10-CM

## 2020-02-14 DIAGNOSIS — Z1159 Encounter for screening for other viral diseases: Secondary | ICD-10-CM

## 2020-02-14 LAB — CBC
HCT: 42.3 % (ref 39.0–52.0)
Hemoglobin: 13.8 g/dL (ref 13.0–17.0)
MCHC: 32.6 g/dL (ref 30.0–36.0)
MCV: 86.6 fl (ref 78.0–100.0)
Platelets: 435 10*3/uL — ABNORMAL HIGH (ref 150.0–400.0)
RBC: 4.89 Mil/uL (ref 4.22–5.81)
RDW: 15.1 % (ref 11.5–15.5)
WBC: 9 10*3/uL (ref 4.0–10.5)

## 2020-02-14 LAB — COMPREHENSIVE METABOLIC PANEL
ALT: 25 U/L (ref 0–53)
AST: 20 U/L (ref 0–37)
Albumin: 4.4 g/dL (ref 3.5–5.2)
Alkaline Phosphatase: 94 U/L (ref 39–117)
BUN: 15 mg/dL (ref 6–23)
CO2: 27 mEq/L (ref 19–32)
Calcium: 9.4 mg/dL (ref 8.4–10.5)
Chloride: 108 mEq/L (ref 96–112)
Creatinine, Ser: 0.75 mg/dL (ref 0.40–1.50)
GFR: 107.35 mL/min (ref >60.00)
Glucose, Bld: 83 mg/dL (ref 70–99)
Potassium: 5.2 mEq/L — ABNORMAL HIGH (ref 3.5–5.1)
Sodium: 138 mEq/L (ref 135–145)
Total Bilirubin: 0.7 mg/dL (ref 0.2–1.2)
Total Protein: 7 g/dL (ref 6.0–8.3)

## 2020-02-14 LAB — LIPID PANEL
Cholesterol: 147 mg/dL (ref 0–200)
HDL: 29.2 mg/dL — ABNORMAL LOW (ref >39.00)
LDL Cholesterol: 103 mg/dL — ABNORMAL HIGH (ref 0–99)
NonHDL: 117.58
Total CHOL/HDL Ratio: 5
Triglycerides: 75 mg/dL (ref 0.0–149.0)
VLDL: 15 mg/dL (ref 0.0–40.0)

## 2020-02-14 LAB — HEMOGLOBIN A1C: Hgb A1c MFr Bld: 5.9 % (ref 4.6–6.5)

## 2020-02-14 MED ORDER — ALBUTEROL SULFATE HFA 108 (90 BASE) MCG/ACT IN AERS: INHALATION_SPRAY | RESPIRATORY_TRACT | 0 refills | Status: DC

## 2020-02-14 NOTE — Assessment & Plan Note (Signed)
Continue compression hose as needed

## 2020-02-14 NOTE — Progress Notes (Signed)
Subjective:    Patient ID: Phillip Gallegos, male    DOB: 1970/09/16, 50 y.o.   MRN: [id number]  HPI  Patient presents the clinic today for his annual exam.  He is also due to follow-up chronic conditions.  Asthma, Mild Persistent: Managed on Flovent. He does feel like this is worse with weight gain.  He is using Albuterol 2-3 times per week.  There are no PFT's on file.  History of Varicose Veins: Managed with compression hose as needed.  He denies redness, warmth, itching or pain.  RLS: He only takes Mirapex as needed. He reports overall he has been sleeping better.   Flu: 12/2019 Tetanus: 05/2016 Covid: Pfizer x 1, 2nd dose scheduled Colon screening: never Vision screen: as needed Dentist: biannually  Diet: He does eat lean meat. He consumes more fruits than veggies. He tries to avoid fried foods. He drinks mostly energy drinks. Exercise: None  Review of Systems      Past Medical History:  Diagnosis Date  . Melanoma in situ of left upper arm ([place])     Current Outpatient Medications  Medication Sig Dispense Refill  . albuterol (VENTOLIN HFA) 108 (90 Base) MCG/ACT inhaler TAKE 2 PUFFS BY MOUTH EVERY 6 HOURS AS NEEDED FOR WHEEZE OR SHORTNESS OF BREATH 8 g 0  . cetirizine (ZYRTEC) 10 MG tablet Take 1 tablet (10 mg total) by mouth daily. LAST REFILL, MUST SCHEDULE PHYSICAL 90 tablet 0  . fluticasone (FLONASE) 50 MCG/ACT nasal spray Place 2 sprays into both nostrils daily. LAST REFILL SCHEDULE PHYSICAL EXAM 48 g 0  . fluticasone (FLOVENT HFA) 44 MCG/ACT inhaler Inhale 1 puff into the lungs 2 (two) times daily. 31.8 g 1  . pramipexole (MIRAPEX) 0.25 MG tablet Take 1 tablet (0.25 mg total) by mouth at bedtime. LAST REFILL SCHEDULE PHYSICAL 90 tablet 0   No current facility-administered medications for this visit.    Allergies  Allergen Reactions  . Penicillins Rash    Family History  Problem Relation Age of Onset  . Hyperlipidemia Father   . Diabetes Father   . Cancer  Neg Hx   . Heart disease Neg Hx   . Stroke Neg Hx     Social History   Socioeconomic History  . Marital status: Married    Spouse name: Not on file  . Number of children: Not on file  . Years of education: Not on file  . Highest education level: Not on file  Occupational History  . Not on file  Tobacco Use  . Smoking status: Former Research scientist (life sciences)  . Smokeless tobacco: Never Used  Substance and Sexual Activity  . Alcohol use: Yes    Comment: social  . Drug use: No  . Sexual activity: Yes  Other Topics Concern  . Not on file  Social History Narrative  . Not on file   Social Determinants of Health   Financial Resource Strain: Not on file  Food Insecurity: Not on file  Transportation Needs: Not on file  Physical Activity: Not on file  Stress: Not on file  Social Connections: Not on file  Intimate Partner Violence: Not on file     Constitutional: Pt reports weight gain. Denies fever, malaise, fatigue, headache.  HEENT: Denies eye pain, eye redness, ear pain, ringing in the ears, wax buildup, runny nose, nasal congestion, bloody nose, or sore throat. Respiratory: Denies difficulty breathing, shortness of breath, cough or sputum production.   Cardiovascular: Pt reports varicose veins. Denies chest pain, chest  tightness, palpitations or swelling in the hands or feet.  Gastrointestinal: Denies abdominal pain, bloating, constipation, diarrhea or blood in the stool.  GU: Denies urgency, frequency, pain with urination, burning sensation, blood in urine, odor or discharge. Musculoskeletal: Denies decrease in range of motion, difficulty with gait, muscle pain or joint pain and swelling.  Skin: Denies redness, rashes, lesions or ulcercations.  Neurological: Pt reports restless legs. Denies dizziness, difficulty with memory, difficulty with speech or problems with balance and coordination.  Psych: Denies anxiety, depression, SI/HI.  No other specific complaints in a complete review of  systems (except as listed in HPI above).  Objective:   Physical Exam  BP 124/82   Pulse 83   Temp (!) 97.5 F (36.4 C) (Temporal)   Ht 5' 10.5" (1.791 m)   Wt 216 lb (98 kg)   SpO2 98%   BMI 30.55 kg/m   Wt Readings from Last 3 Encounters:  01/04/20 214 lb (97.1 kg)  12/20/18 195 lb (88.5 kg)  07/27/17 203 lb (92.1 kg)    General: Appears his stated age, well developed, well nourished in NAD. Skin: Warm, dry and intact. No rashes noted. HEENT: Head: normal shape and size; Eyes: sclera white, no icterus, conjunctiva pink, PERRLA and EOMs intact;  Neck:  Neck supple, trachea midline. No masses, lumps or thyromegaly present.  Cardiovascular: Normal rate and rhythm. S1,S2 noted.  No murmur, rubs or gallops noted. No JVD or BLE edema.  Pulmonary/Chest: Normal effort and positive vesicular breath sounds. No respiratory distress. No wheezes, rales or ronchi noted.  Abdomen: Soft and nontender. Normal bowel sounds. No distention or masses noted. Liver, spleen and kidneys non palpable. Musculoskeletal: Strength 5/5 BUE/BLE. No difficulty with gait.  Neurological: Alert and oriented. Cranial nerves II-XII grossly intact. Coordination normal.  Psychiatric: Mood and affect normal. Behavior is normal. Judgment and thought content normal.     BMET    Component Value Date/Time   NA 138 12/20/2018 1253   NA 141 05/02/2011 2217   K 5.0 12/20/2018 1253   K 3.9 05/02/2011 2217   CL 105 12/20/2018 1253   CL 104 05/02/2011 2217   CO2 28 12/20/2018 1253   CO2 28 05/02/2011 2217   GLUCOSE 86 12/20/2018 1253   GLUCOSE 86 05/02/2011 2217   BUN 14 12/20/2018 1253   BUN 11 05/02/2011 2217   CREATININE 0.76 12/20/2018 1253   CREATININE 0.71 05/29/2017 1533   CALCIUM 9.5 12/20/2018 1253   CALCIUM 8.4 (L) 05/02/2011 2217   GFRNONAA >60 05/02/2011 2217   GFRAA >60 05/02/2011 2217    Lipid Panel     Component Value Date/Time   CHOL 165 12/20/2018 1253   TRIG 56.0 12/20/2018 1253   HDL  39.90 12/20/2018 1253   CHOLHDL 4 12/20/2018 1253   VLDL 11.2 12/20/2018 1253   LDLCALC 114 (H) 12/20/2018 1253   LDLCALC 107 (H) 05/29/2017 1533    CBC    Component Value Date/Time   WBC 9.3 12/20/2018 1253   RBC 4.78 12/20/2018 1253   HGB 13.8 12/20/2018 1253   HGB 13.1 05/02/2011 2217   HCT 41.5 12/20/2018 1253   HCT 41.3 05/02/2011 2217   PLT 419.0 (H) 12/20/2018 1253   PLT 505 (H) 05/02/2011 2217   MCV 86.9 12/20/2018 1253   MCV 81 05/02/2011 2217   MCH 27.3 05/29/2017 1533   MCHC 33.2 12/20/2018 1253   RDW 15.5 12/20/2018 1253   RDW 18.8 (H) 05/02/2011 2217    Hgb A1C   No results found for: HGBA1C        Assessment & Plan:   Preventative Health Maintenance:  Flu shot UTD Tetanus UTD He is scheduled for his 2nd Covid booster Referral to GI for screening colonoscopy Encouraged him to consume a balanced diet and exercise regimen Advised him to see an eye doctor and dentist annually We will check CBC, C met, lipid, A1c and hep C today  RTC in 1 year, sooner if needed Regina Baity, NP This visit occurred during the SARS-CoV-2 public health emergency.  Safety protocols were in place, including screening questions prior to the visit, additional usage of staff PPE, and extensive cleaning of exam room while observing appropriate contact time as indicated for disinfecting solutions.     

## 2020-02-14 NOTE — Assessment & Plan Note (Signed)
Can increase Flovent to 1-2 puffs BID to reduce Albuterol use Encouraged weight loss if this is a contributing factor Will monitor

## 2020-02-14 NOTE — Assessment & Plan Note (Signed)
Continue Mirapex as needed

## 2020-02-14 NOTE — Patient Instructions (Signed)
Caring for Your Mental Health Mental health is emotional, psychological, and social well-being. Mental health is just as important as physical health. In fact, mental and physical health are connected, and you need both to be healthy. Some signs of good mental health (well-being) include:  Being able to attend to tasks at home, school, or work.  Being able to manage stress and emotions.  Practicing self-care, which may include: ? A regular exercise pattern. ? A reasonably healthy diet. ? Supportive and trusting relationships. ? The ability to relax and calm yourself (self-calm).  Having pleasurable hobbies and activities to do.  Believing that you have meaning and purpose in your life.  Recovering and adjusting after facing challenges (resilience). You can take steps to build or strengthen these mentally healthy behaviors. There are resources and support to help you with this. Why is caring for mental health important? Caring for your mental health is a big part of staying healthy. Everyone has times when feelings, thoughts, or situations feel overwhelming. Mental health means having the skills to manage what feels overwhelming. If this sense of being overwhelmed persists, however, you might need some help. If you have some of the following signs, you may need to take better care of your mental health or seek help from a health care provider or mental health professional:  Problems with energy or focus.  Changes in eating habits.  Problems sleeping, such as sleeping too much or not enough.  Emotional distress, such as anger, sadness, depression, or anxiety.  Major changes in your relationships.  Losing interest in life or activities that you used to enjoy. If you have any of these symptoms on most days for 2 weeks or longer:  Talk with a close friend or family member about how you are feeling.  Contact your health care provider to discuss your symptoms.  Consider working with a  mental health professional. Your health care provider, family, or friends may be able to recommend a therapist. What can I do to promote emotional and mental health? Managing emotions  Learn to identify emotions and deal with them. Recognizing your emotions is the first step in learning to deal with them.  Practice ways to appropriately express feelings. Remember that you can control your feelings. They do not control you.  Practice stress management techniques, such as: ? Relaxation techniques, like breathing or muscle relaxation exercises. ? Exercise. Regular activity can lower your stress level. ? Changing what you can change and accepting what you cannot change.  Build up your resilience so that you can recover and adjust after big problems or challenges. Practice resilient behaviors and attitudes: ? Set and focus on long-term goals. ? Develop and maintain healthy, supportive relationships. ? Learn to accept change and make the best of the situation. ? Take care of yourself physically by eating a healthy diet, getting plenty of sleep, and exercising regularly. ? Develop self-awareness. Ask others to give feedback about how they see you. ? Practice mindfulness meditation to help you stay calm when dealing with daily challenges. ? Learn to respond to situations in healthy ways, rather than reacting with your emotions. ? Keep a positive attitude, and believe in yourself. Your view of yourself affects your mental health. ? Develop your listening and empathy skills. These will help you deal with difficult situations and communications.  Remember that emotions can be used as a good source of communication and are a great source of energy. Try to laugh and find humor in life.   Sleeping  Get the right amount and quality of sleep. Sleep has a big impact on physical and mental health. To improve your sleep: ? Go to bed and wake up around the same time every day. ? Limit screen time before  bedtime. This includes the use of your cell phone, TV, computer, and tablet. ? Keep your bedroom dark and cool. Activity  Exercise or do some physical activity regularly. This helps: ? Keep your body strong, especially during times of stress. ? Get rid of chemicals in your body (hormones) that build up when you are stressed. ? Build up your resilience.   Eating and drinking  Eat a healthy diet that includes whole grains, vegetables, fresh fruits, and lean proteins. If you have questions about what foods are best for you, ask your health care provider.  Try not to turn to sweet, salty, or otherwise unhealthy foods when you are tired or unhappy. This can lead to unwanted weight gain and is not a healthy way to cope with emotions.   Where to find more information You can find more information about how to care for your mental health from:  National Alliance on Mental Illness (NAMI): www.nami.org  National Institute of Mental Health: www.nimh.nih.gov  Centers for Disease Control and Prevention: www.cdc.gov/hrqol/wellbeing.htm Contact a health care provider if:  You lose interest in being with others or you do not want to leave the house.  You have a hard time completing your normal activities or you have less energy than normal.  You cannot stay focused or you have problems with memory.  You feel that your senses are heightened, and this makes you upset or concerned.  You feel nervous or have rapid mood changes.  You are sleeping or eating more or less than normal.  You question reality or you show odd behavior that disturbs you or others. Get help right away if:  You have thoughts about hurting yourself or others. If you ever feel like you may hurt yourself or others, or have thoughts about taking your own life, get help right away. You can go to your nearest emergency department or call:  Your local emergency services (911 in the [place]  A suicide crisis helpline, such as the  National Suicide Prevention Lifeline at 1-800-273-8255. This is open 24 hours a day. Summary  Mental health is not just the absence of mental illness. It involves understanding your emotions and behaviors, and taking steps to cope with them in a healthy way.  If you have symptoms of mental or emotional distress, get help from family, friends, a health care provider, or a mental health professional.  Practice good mental health behaviors such as stress management skills, self-calming skills, exercise, and healthy sleeping and eating. This information is not intended to replace advice given to you by your health care provider. Make sure you discuss any questions you have with your health care provider. Document Revised: 06/30/2019 Document Reviewed: 06/30/2019 Elsevier Patient Education  2021 Elsevier Inc.  

## 2020-02-15 LAB — HEPATITIS C ANTIBODY
Hepatitis C Ab: NONREACTIVE
SIGNAL TO CUT-OFF: 0.04 (ref <1.00)

## 2020-03-16 DIAGNOSIS — Z6829 Body mass index (BMI) 29.0-29.9, adult: Secondary | ICD-10-CM | POA: Diagnosis not present

## 2020-03-16 DIAGNOSIS — Z880 Allergy status to penicillin: Secondary | ICD-10-CM | POA: Diagnosis not present

## 2020-03-16 DIAGNOSIS — Z79899 Other long term (current) drug therapy: Secondary | ICD-10-CM | POA: Diagnosis not present

## 2020-03-16 DIAGNOSIS — C4362 Malignant melanoma of left upper limb, including shoulder: Secondary | ICD-10-CM | POA: Diagnosis not present

## 2020-04-28 ENCOUNTER — Other Ambulatory Visit: Payer: Self-pay | Admitting: Internal Medicine

## 2020-05-08 ENCOUNTER — Other Ambulatory Visit: Payer: Self-pay | Admitting: Internal Medicine

## 2020-05-08 ENCOUNTER — Encounter: Payer: Self-pay | Admitting: Internal Medicine

## 2020-05-08 MED ORDER — ALBUTEROL SULFATE HFA 108 (90 BASE) MCG/ACT IN AERS: INHALATION_SPRAY | RESPIRATORY_TRACT | 0 refills | Status: DC

## 2020-05-09 MED ORDER — ALBUTEROL SULFATE HFA 108 (90 BASE) MCG/ACT IN AERS: INHALATION_SPRAY | RESPIRATORY_TRACT | 0 refills | Status: DC

## 2020-06-05 ENCOUNTER — Telehealth: Payer: Self-pay | Admitting: *Deleted

## 2020-06-05 NOTE — Telephone Encounter (Signed)
Received a fax from [facility] stating that they no longer cover Albuterol Sulfate. Message shows that the preferred is Proair HFA. Patient received a refill on 05/09/20  18G.  Left message on voicemail. When patient calls back need to see if he needs a refill at this time. Last office visit 02/14/20 Last refill 05/09/20 Upcoming appointment 08/21/20 with Arlyss Repress Allwardt PA-C

## 2020-06-11 NOTE — Telephone Encounter (Signed)
Will wait for patient to call back and advise if he needs a refill or not. Patient has an upcoming appointment with Alyssa Allwardt.PA-C.

## 2020-06-22 ENCOUNTER — Other Ambulatory Visit: Payer: Self-pay

## 2020-06-22 NOTE — Telephone Encounter (Signed)
Pharmacy requests refill on: Flovent [facility] Inhaler   LAST REFILL: 04/30/2020 (Q-31.8 g, R-1) LAST OV: 02/14/2020 NEXT OV: 08/21/2020 PHARMACY: [facility]

## 2020-06-25 MED ORDER — FLOVENT HFA 44 MCG/ACT IN AERO: 1.0000 | INHALATION_SPRAY | Freq: Two times a day (BID) | RESPIRATORY_TRACT | 1 refills | Status: DC

## 2020-06-29 ENCOUNTER — Other Ambulatory Visit: Payer: Self-pay | Admitting: Internal Medicine

## 2020-07-04 MED ORDER — ALBUTEROL SULFATE HFA 108 (90 BASE) MCG/ACT IN AERS: INHALATION_SPRAY | RESPIRATORY_TRACT | 0 refills | Status: DC

## 2020-08-20 ENCOUNTER — Encounter: Payer: 59 | Admitting: Physician Assistant

## 2020-08-21 ENCOUNTER — Encounter: Payer: 59 | Admitting: Physician Assistant

## 2020-08-21 ENCOUNTER — Encounter: Payer: BC Managed Care – PPO | Admitting: Physician Assistant

## 2020-08-30 ENCOUNTER — Other Ambulatory Visit: Payer: Self-pay | Admitting: Physician Assistant

## 2020-08-30 ENCOUNTER — Other Ambulatory Visit: Payer: Self-pay

## 2020-08-30 ENCOUNTER — Encounter: Payer: Self-pay | Admitting: Physician Assistant

## 2020-08-30 ENCOUNTER — Ambulatory Visit: Payer: BC Managed Care – PPO | Admitting: Physician Assistant

## 2020-08-30 VITALS — BP 117/77 | HR 73 | Temp 98.0°F | Ht 70.5 in | Wt 227.2 lb

## 2020-08-30 DIAGNOSIS — R202 Paresthesia of skin: Secondary | ICD-10-CM

## 2020-08-30 DIAGNOSIS — R200 Anesthesia of skin: Secondary | ICD-10-CM

## 2020-08-30 MED ORDER — ALBUTEROL SULFATE HFA 108 (90 BASE) MCG/ACT IN AERS: INHALATION_SPRAY | RESPIRATORY_TRACT | 2 refills | Status: DC

## 2020-08-30 MED ORDER — ALBUTEROL SULFATE HFA 108 (90 BASE) MCG/ACT IN AERS: 2.0000 | INHALATION_SPRAY | Freq: Four times a day (QID) | RESPIRATORY_TRACT | 2 refills | Status: DC | PRN

## 2020-08-30 MED ORDER — NAPROXEN 500 MG PO TABS
500.0000 mg | ORAL_TABLET | Freq: Two times a day (BID) | ORAL | 0 refills | Status: DC
Start: 2020-08-30 — End: 2020-09-07

## 2020-08-30 NOTE — Progress Notes (Signed)
Established Patient Office Visit  Subjective:  Patient ID: Phillip Gallegos, male    DOB: September 16, 1970  Age: 50 y.o. MRN: [id number]  CC:  Chief Complaint  Patient presents with   Numbness    Left Leg tingling x 3 weeks    HPI Phillip Gallegos presents for left leg tingling off and on x 3 weeks. Starts down the middle of his thigh and extends in to calf and ankle. No pain. No rash. No hx of back pain. No injury. 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   Pain improves with lying down and propping leg up. Sometimes sitting for prolonged times will exacerbate it.   Past Medical History:  Diagnosis Date   Melanoma in situ of left upper arm (HCC)     History reviewed. No pertinent surgical history.  Family History  Problem Relation Age of Onset   Hyperlipidemia Father    Diabetes Father    Cancer Neg Hx    Heart disease Neg Hx    Stroke Neg Hx     Social History   Socioeconomic History   Marital status: Married    Spouse name: Not on file   Number of children: Not on file   Years of education: Not on file   Highest education level: Not on file  Occupational History   Not on file  Tobacco Use   Smoking status: Former   Smokeless tobacco: Never  Substance and Sexual Activity   Alcohol use: Yes    Comment: social   Drug use: No   Sexual activity: Yes  Other Topics Concern   Not on file  Social History Narrative   Not on file   Social Determinants of Health   Financial Resource Strain: Not on file  Food Insecurity: Not on file  Transportation Needs: Not on file  Physical Activity: Not on file  Stress: Not on file  Social Connections: Not on file  Intimate Partner Violence: Not on file    Outpatient Medications Prior to Visit  Medication Sig Dispense Refill   cetirizine (ZYRTEC) 10 MG tablet Take 1 tablet (10 mg total) by mouth daily. LAST REFILL, MUST SCHEDULE PHYSICAL 90 tablet 0   fluticasone (FLONASE) 50 MCG/ACT nasal spray Place 2 sprays into both nostrils daily.  LAST REFILL SCHEDULE PHYSICAL EXAM 48 g 0   fluticasone (FLOVENT HFA) 44 MCG/ACT inhaler Inhale 1 puff into the lungs 2 (two) times daily. 31.8 g 1   pramipexole (MIRAPEX) 0.25 MG tablet Take 1 tablet (0.25 mg total) by mouth at bedtime. LAST REFILL SCHEDULE PHYSICAL 90 tablet 0   albuterol (VENTOLIN HFA) 108 (90 Base) MCG/ACT inhaler TAKE 2 PUFFS BY MOUTH EVERY 6 HOURS AS NEEDED FOR WHEEZE OR SHORTNESS OF BREATH 18 g 0   No facility-administered medications prior to visit.    Allergies  Allergen Reactions   Penicillins Rash    ROS Review of Systems REFER TO HPI FOR PERTINENT POSITIVES AND NEGATIVES    Objective:    Physical Exam Vitals and nursing note reviewed.  Constitutional:      Appearance: Normal appearance. He is normal weight.  Cardiovascular:     Rate and Rhythm: Normal rate and regular rhythm.     Pulses: Normal pulses.     Heart sounds: Normal heart sounds. No murmur heard. Pulmonary:     Effort: Pulmonary effort is normal. No respiratory distress.     Breath sounds: Normal breath sounds.  Musculoskeletal:     Lumbar  back: Normal. Negative right straight leg raise test and negative left straight leg raise test.     Right upper leg: Normal.     Left upper leg: Normal.     Right lower leg: Normal. No swelling, tenderness or bony tenderness. No edema.     Left lower leg: Normal. No swelling, tenderness or bony tenderness. No edema.  Skin:    General: Skin is warm and dry.  Neurological:     General: No focal deficit present.     Mental Status: He is alert and oriented to person, place, and time.     Cranial Nerves: No cranial nerve deficit.     Sensory: No sensory deficit.     Motor: No weakness.     Gait: Gait normal.     Deep Tendon Reflexes: Reflexes normal.    BP 117/77   Pulse 73   Temp 98 F (36.7 C)   Ht 5' 10.5" (1.791 m)   Wt 227 lb 3.2 oz (103.1 kg)   SpO2 99%   BMI 32.14 kg/m  Wt Readings from Last 3 Encounters:  08/30/20 227 lb 3.2 oz  (103.1 kg)  02/14/20 216 lb (98 kg)  01/04/20 214 lb (97.1 kg)     Health Maintenance Due  Topic Date Due   Pneumococcal Vaccine 72-64 Years old (1 - PCV) Never done   COLONOSCOPY (Pts 45-20yrs Insurance coverage will need to be confirmed)  Never done   INFLUENZA VACCINE  08/20/2020    There are no preventive care reminders to display for this patient.  Lab Results  Component Value Date   TSH 3.40 05/29/2017   Lab Results  Component Value Date   WBC 9.0 02/14/2020   HGB 13.8 02/14/2020   HCT 42.3 02/14/2020   MCV 86.6 02/14/2020   PLT 435.0 (H) 02/14/2020   Lab Results  Component Value Date   NA 138 02/14/2020   K 5.2 (H) 02/14/2020   CO2 27 02/14/2020   GLUCOSE 83 02/14/2020   BUN 15 02/14/2020   CREATININE 0.75 02/14/2020   BILITOT 0.7 02/14/2020   ALKPHOS 94 02/14/2020   AST 20 02/14/2020   ALT 25 02/14/2020   PROT 7.0 02/14/2020   ALBUMIN 4.4 02/14/2020   CALCIUM 9.4 02/14/2020   ANIONGAP 9 05/02/2011   GFR 107.35 02/14/2020   Lab Results  Component Value Date   CHOL 147 02/14/2020   Lab Results  Component Value Date   HDL 29.20 (L) 02/14/2020   Lab Results  Component Value Date   LDLCALC 103 (H) 02/14/2020   Lab Results  Component Value Date   TRIG 75.0 02/14/2020   Lab Results  Component Value Date   CHOLHDL 5 02/14/2020   Lab Results  Component Value Date   HGBA1C 5.9 02/14/2020      Assessment & Plan:   Problem List Items Addressed This Visit   None Visit Diagnoses     Numbness and tingling of left leg    -  Primary       Meds ordered this encounter  Medications   naproxen (NAPROSYN) 500 MG tablet    Sig: Take 1 tablet (500 mg total) by mouth 2 (two) times daily with a meal for 10 days.    Dispense:  20 tablet    Refill:  0   albuterol (VENTOLIN HFA) 108 (90 Base) MCG/ACT inhaler    Sig: TAKE 2 PUFFS BY MOUTH EVERY 6 HOURS AS NEEDED FOR WHEEZE OR SHORTNESS OF BREATH  Dispense:  18 g    Refill:  2    Follow-up:  Return in about 3 weeks (around 09/20/2020) for recheck .   1. Numbness and tingling of left leg No obvious cause on exam. Possible nerve irritation. Will start conservatively at this time with Naproxen 500 mg twice daily for 10 days. Rest and elevation. If worse or no improvement, need to consider nerve conduction study. Pt understanding and agreeable.    Terecia Plaut M Karon Heckendorn, PA-C

## 2020-08-30 NOTE — Patient Instructions (Signed)
Take the naproxen as directed. F/up in 2-3 weeks. Call sooner if worse or any changes.

## 2020-08-30 NOTE — Progress Notes (Signed)
Rx sent 

## 2020-09-06 ENCOUNTER — Encounter: Payer: Self-pay | Admitting: Physician Assistant

## 2020-09-07 ENCOUNTER — Other Ambulatory Visit: Payer: Self-pay

## 2020-09-07 MED ORDER — NAPROXEN 500 MG PO TABS: 500.0000 mg | ORAL_TABLET | Freq: Two times a day (BID) | ORAL | 0 refills | Status: AC

## 2020-09-13 DIAGNOSIS — Z6831 Body mass index (BMI) 31.0-31.9, adult: Secondary | ICD-10-CM | POA: Diagnosis not present

## 2020-09-13 DIAGNOSIS — C4362 Malignant melanoma of left upper limb, including shoulder: Secondary | ICD-10-CM | POA: Diagnosis not present

## 2020-09-21 ENCOUNTER — Ambulatory Visit: Payer: BC Managed Care – PPO | Admitting: Physician Assistant

## 2020-09-25 DIAGNOSIS — I8311 Varicose veins of right lower extremity with inflammation: Secondary | ICD-10-CM | POA: Diagnosis not present

## 2020-09-25 DIAGNOSIS — I8312 Varicose veins of left lower extremity with inflammation: Secondary | ICD-10-CM | POA: Diagnosis not present

## 2020-10-09 ENCOUNTER — Telehealth: Payer: Self-pay | Admitting: Physician Assistant

## 2020-10-12 ENCOUNTER — Other Ambulatory Visit: Payer: Self-pay

## 2020-10-12 ENCOUNTER — Telehealth: Payer: Self-pay

## 2020-10-12 MED ORDER — FLUTICASONE PROPIONATE HFA 44 MCG/ACT IN AERO: 1.0000 | INHALATION_SPRAY | Freq: Two times a day (BID) | RESPIRATORY_TRACT | 1 refills | Status: DC

## 2020-10-12 MED ORDER — FLUTICASONE PROPIONATE 50 MCG/ACT NA SUSP: 2.0000 | Freq: Every day | NASAL | 2 refills | Status: AC

## 2020-10-12 NOTE — Telephone Encounter (Signed)
Rx sent in

## 2020-10-15 DIAGNOSIS — I8311 Varicose veins of right lower extremity with inflammation: Secondary | ICD-10-CM | POA: Diagnosis not present

## 2020-10-15 DIAGNOSIS — I8312 Varicose veins of left lower extremity with inflammation: Secondary | ICD-10-CM | POA: Diagnosis not present

## 2020-10-24 DIAGNOSIS — I8312 Varicose veins of left lower extremity with inflammation: Secondary | ICD-10-CM | POA: Diagnosis not present

## 2020-10-24 DIAGNOSIS — I8311 Varicose veins of right lower extremity with inflammation: Secondary | ICD-10-CM | POA: Diagnosis not present

## 2020-12-06 ENCOUNTER — Encounter: Payer: Self-pay | Admitting: Physician Assistant

## 2020-12-07 ENCOUNTER — Other Ambulatory Visit: Payer: Self-pay

## 2020-12-07 DIAGNOSIS — R200 Anesthesia of skin: Secondary | ICD-10-CM

## 2020-12-28 ENCOUNTER — Other Ambulatory Visit: Payer: Self-pay

## 2020-12-28 DIAGNOSIS — R202 Paresthesia of skin: Secondary | ICD-10-CM

## 2020-12-28 DIAGNOSIS — R200 Anesthesia of skin: Secondary | ICD-10-CM

## 2021-01-15 ENCOUNTER — Ambulatory Visit (INDEPENDENT_AMBULATORY_CARE_PROVIDER_SITE_OTHER): Payer: BC Managed Care – PPO | Admitting: Vascular Surgery

## 2021-01-15 ENCOUNTER — Other Ambulatory Visit: Payer: Self-pay

## 2021-01-15 ENCOUNTER — Encounter: Payer: Self-pay | Admitting: Vascular Surgery

## 2021-01-15 ENCOUNTER — Ambulatory Visit (HOSPITAL_COMMUNITY)
Admission: RE | Admit: 2021-01-15 | Discharge: 2021-01-15 | Disposition: A | Discharge status: Home / Self Care | Payer: BC Managed Care – PPO | Source: Ambulatory Visit | Attending: Vascular Surgery | Admitting: Vascular Surgery

## 2021-01-15 DIAGNOSIS — R202 Paresthesia of skin: Secondary | ICD-10-CM

## 2021-01-15 DIAGNOSIS — R200 Anesthesia of skin: Secondary | ICD-10-CM

## 2021-01-15 NOTE — Progress Notes (Signed)
Patient name: Phillip Gallegos MRN: [id number] DOB: 1971-01-09 Sex: male  REASON FOR CONSULT: Evaluate left lower extremity numbness  HPI: Phillip Gallegos is a 50 y.o. male, with no significant medical history that presents for evaluation of left lower extremity numbness.  He states he has numbness from the mid thigh all the way down into the foot.  This has been ongoing for about 3 to 4 months.  Usually worse when sitting for prolonged periods of time.  He sits for long periods of time at work.  It comes and goes and at times does not bother him.  Not worse with exercise or long walks.  Does not smoke currently and quit smoking in 2000.  Sounds like he had some vascular studies with [facility] although I do not have a copy of these.  States he has had no further work-up for the left leg numbness.  Past Medical History:  Diagnosis Date   Melanoma in situ of left upper arm (HCC)     History reviewed. No pertinent surgical history.  Family History  Problem Relation Age of Onset   Hyperlipidemia Father    Diabetes Father    Cancer Neg Hx    Heart disease Neg Hx    Stroke Neg Hx     SOCIAL HISTORY: Social History   Socioeconomic History   Marital status: Married    Spouse name: Not on file   Number of children: Not on file   Years of education: Not on file   Highest education level: Not on file  Occupational History   Not on file  Tobacco Use   Smoking status: Former   Smokeless tobacco: Never  Substance and Sexual Activity   Alcohol use: Yes    Comment: social   Drug use: No   Sexual activity: Yes  Other Topics Concern   Not on file  Social History Narrative   Not on file   Social Determinants of Health   Financial Resource Strain: Not on file  Food Insecurity: Not on file  Transportation Needs: Not on file  Physical Activity: Not on file  Stress: Not on file  Social Connections: Not on file  Intimate Partner Violence: Not on file    Allergies  Allergen  Reactions   Penicillins Rash    Current Outpatient Medications  Medication Sig Dispense Refill   albuterol (VENTOLIN HFA) 108 (90 Base) MCG/ACT inhaler TAKE 2 PUFFS BY MOUTH EVERY 6 HOURS AS NEEDED FOR WHEEZE OR SHORTNESS OF BREATH 18 g 2   cetirizine (ZYRTEC) 10 MG tablet Take 1 tablet (10 mg total) by mouth daily. LAST REFILL, MUST SCHEDULE PHYSICAL 90 tablet 0   fluticasone (FLONASE) 50 MCG/ACT nasal spray Place 2 sprays into both nostrils daily. LAST REFILL SCHEDULE PHYSICAL EXAM 48 g 2   fluticasone (FLOVENT HFA) 44 MCG/ACT inhaler Inhale 1 puff into the lungs 2 (two) times daily. 31.8 g 1   pramipexole (MIRAPEX) 0.25 MG tablet Take 1 tablet (0.25 mg total) by mouth at bedtime. LAST REFILL SCHEDULE PHYSICAL 90 tablet 0   albuterol (PROAIR HFA) 108 (90 Base) MCG/ACT inhaler Inhale 2 puffs into the lungs every 6 (six) hours as needed for wheezing or shortness of breath. 6.7 g 2   No current facility-administered medications for this visit.    REVIEW OF SYSTEMS:  [X]  denotes positive finding, [ ]  denotes negative finding Cardiac  Comments:  Chest pain or chest pressure:    Shortness of breath upon  exertion:    Short of breath when lying flat:    Irregular heart rhythm:        Vascular    Pain in calf, thigh, or hip brought on by ambulation:    Pain in feet at night that wakes you up from your sleep:     Blood clot in your veins:    Leg swelling:         Pulmonary    Oxygen at home:    Productive cough:     Wheezing:         Neurologic    Sudden weakness in arms or legs:     Sudden numbness in arms or legs:     Sudden onset of difficulty speaking or slurred speech:    Temporary loss of vision in one eye:     Problems with dizziness:         Gastrointestinal    Blood in stool:     Vomited blood:         Genitourinary    Burning when urinating:     Blood in urine:        Psychiatric    Major depression:         Hematologic    Bleeding problems:    Problems with  blood clotting too easily:        Skin    Rashes or ulcers:        Constitutional    Fever or chills:      PHYSICAL EXAM: Vitals:   01/15/21 1516  BP: (!) 142/88  Pulse: 94  Resp: 18  Temp: 98.1 F (36.7 C)  TempSrc: Temporal  SpO2: 97%  Weight: 228 lb (103.4 kg)  Height: 6' (1.829 m)    GENERAL: The patient is a well-nourished male, in no acute distress. The vital signs are documented above. CARDIAC: There is a regular rate and rhythm.  VASCULAR:  Palpable femoral pulses bilaterally Palpable DP pulses bilaterally No lower extremity tissue loss PULMONARY: No respiratory distress. ABDOMEN: Soft and non-tender. MUSCULOSKELETAL: There are no major deformities or cyanosis. NEUROLOGIC: No focal weakness or paresthesias are detected. SKIN: There are no ulcers or rashes noted. PSYCHIATRIC: The patient has a normal affect.  DATA:   ABIs 1.13 on the right triphasic and 1.08 on the left triphasic with toe pressure 90 on the right 35 on the left  Assessment/Plan:  51 year old male presents for evaluation of left lower extremity numbness from the left thigh down into the foot.  I discussed that his ABIs on the left are 1.08 and triphasic at the ankle with no evidence of large vessel arterial disease.  He also has a normal exam with easily palpable dorsalis pedis pulse in the left foot.  He does have a fairly flat toe tracing on the left that can be suggestive of small vessel disease but this is generally not amendable to any surgical intervention.  I personally do not think his left leg intermittent numbness is related to arterial insufficiency again with normal ABI and a normal exam.  In addition he has no worsening symptoms with exertion which would be expected with arterial insufficiency.  I discussed he evaluate other etiologies like sciatic from his back or potentially seeing neurologist for evaluation of neuronopathy and he will follow-up with his PCP for further  studies.   Cephus Shelling, MD [facility] Office: [phone]

## 2021-01-23 ENCOUNTER — Encounter: Payer: Self-pay | Admitting: Physician Assistant

## 2021-02-03 DIAGNOSIS — L03032 Cellulitis of left toe: Secondary | ICD-10-CM | POA: Diagnosis not present

## 2021-02-05 ENCOUNTER — Ambulatory Visit: Payer: BC Managed Care – PPO | Admitting: Physician Assistant

## 2021-03-27 ENCOUNTER — Other Ambulatory Visit: Payer: Self-pay

## 2021-03-27 ENCOUNTER — Encounter: Payer: Self-pay | Admitting: Physician Assistant

## 2021-03-27 ENCOUNTER — Ambulatory Visit: Payer: BC Managed Care – PPO | Admitting: Physician Assistant

## 2021-03-27 VITALS — BP 133/87 | HR 79 | Temp 98.2°F | Ht 72.0 in | Wt 231.2 lb

## 2021-03-27 DIAGNOSIS — M79644 Pain in right finger(s): Secondary | ICD-10-CM | POA: Diagnosis not present

## 2021-03-27 DIAGNOSIS — I731 Thromboangiitis obliterans [Buerger's disease]: Secondary | ICD-10-CM

## 2021-03-27 LAB — COMPREHENSIVE METABOLIC PANEL
ALT: 30 U/L (ref 0–53)
AST: 25 U/L (ref 0–37)
Albumin: 4.1 g/dL (ref 3.5–5.2)
Alkaline Phosphatase: 79 U/L (ref 39–117)
BUN: 12 mg/dL (ref 6–23)
CO2: 25 mEq/L (ref 19–32)
Calcium: 9 mg/dL (ref 8.4–10.5)
Chloride: 110 mEq/L (ref 96–112)
Creatinine, Ser: 0.72 mg/dL (ref 0.40–1.50)
GFR: 107.83 mL/min (ref >60.00)
Glucose, Bld: 99 mg/dL (ref 70–99)
Potassium: 4.9 mEq/L (ref 3.5–5.1)
Sodium: 137 mEq/L (ref 135–145)
Total Bilirubin: 0.5 mg/dL (ref 0.2–1.2)
Total Protein: 6.7 g/dL (ref 6.0–8.3)

## 2021-03-27 LAB — CBC WITH DIFFERENTIAL/PLATELET
Basophils Absolute: 0.1 10*3/uL (ref 0.0–0.1)
Basophils Relative: 1.1 % (ref 0.0–3.0)
Eosinophils Absolute: 0.3 10*3/uL (ref 0.0–0.7)
Eosinophils Relative: 3.1 % (ref 0.0–5.0)
HCT: 42.6 % (ref 39.0–52.0)
Hemoglobin: 13.7 g/dL (ref 13.0–17.0)
Lymphocytes Relative: 16.3 % (ref 12.0–46.0)
Lymphs Abs: 1.5 10*3/uL (ref 0.7–4.0)
MCHC: 32.1 g/dL (ref 30.0–36.0)
MCV: 86.2 fl (ref 78.0–100.0)
Monocytes Absolute: 0.8 10*3/uL (ref 0.1–1.0)
Monocytes Relative: 8 % (ref 3.0–12.0)
Neutro Abs: 6.8 10*3/uL (ref 1.4–7.7)
Neutrophils Relative %: 71.5 % (ref 43.0–77.0)
Platelets: 436 10*3/uL — ABNORMAL HIGH (ref 150.0–400.0)
RBC: 4.94 Mil/uL (ref 4.22–5.81)
RDW: 15.9 % — ABNORMAL HIGH (ref 11.5–15.5)
WBC: 9.5 10*3/uL (ref 4.0–10.5)

## 2021-03-27 LAB — C-REACTIVE PROTEIN: CRP: <1 mg/dL (ref 0.5–20.0)

## 2021-03-27 LAB — SEDIMENTATION RATE: Sed Rate: 24 mm/hr — ABNORMAL HIGH (ref 0–15)

## 2021-03-27 NOTE — Patient Instructions (Signed)
Good to see you today! ?Please go to the lab for blood work and I will send results through MyChart. ?I have placed an urgent referral to [facility].  ? ?Please call them later today [phone] to schedule urgently. ? ?If acutely worse, please go straight to ER. ? ?

## 2021-03-27 NOTE — Progress Notes (Signed)
? ?Subjective:  ? ? Patient ID: Phillip Gallegos, male    DOB: [birth year], 51 y.o.   MRN: [id number] ? ?Chief Complaint  ?Patient presents with  ? Hand Pain  ?  Left hand numbness and tingling  ? Numbness  ? ? ?Hand Pain  ? ?Patient is in today for Left index finger numbness and tingling. L hand dominant. He is fasting today. ? ?Sx's started 03/12/21 - brown "splintering color" in R index fingernail. A few days later started with numbness/ color change/ coldness. Went to [place] after that, which was 30 degree weather, didn't pay much attention to this.  ? ?Pain going into palm and thumb this morning. Stretching it out has helped. ? ?Left second toe turned colors a few weeks prior to that. Went to urgent care and was given an antibiotic.  ? ?Left leg numbness - more sporadic, better.  ? ?No recent COVID-19 illness. Possibly had COVID end of 2019, very sick, bed-ridden flu-like at that time.  ? ?Not a recent smoker. Prior hx smoking 20+ years ago.  ? ?Past Medical History:  ?Diagnosis Date  ? Melanoma in situ of left upper arm (HCC)   ? ? ?No past surgical history on file. ? ?Family History  ?Problem Relation Age of Onset  ? Hyperlipidemia Father   ? Diabetes Father   ? Cancer Neg Hx   ? Heart disease Neg Hx   ? Stroke Neg Hx   ? ? ?Social History  ? ?Tobacco Use  ? Smoking status: Former  ? Smokeless tobacco: Never  ?Substance Use Topics  ? Alcohol use: Yes  ?  Comment: social  ? Drug use: No  ?  ? ?Allergies  ?Allergen Reactions  ? Penicillins Rash  ? ? ?Review of Systems ?NEGATIVE UNLESS OTHERWISE INDICATED IN HPI ? ? ?   ?Objective:  ?  ? ?BP 133/87   Pulse 79   Temp 98.2 ?F (36.8 ?C)   Ht 6' (1.829 m)   Wt 231 lb 4 oz (104.9 kg)   SpO2 97%   BMI 31.36 kg/m?  ? ?Wt Readings from Last 3 Encounters:  ?03/27/21 231 lb 4 oz (104.9 kg)  ?01/15/21 228 lb (103.4 kg)  ?08/30/20 227 lb 3.2 oz (103.1 kg)  ? ? ?BP Readings from Last 3 Encounters:  ?03/27/21 133/87  ?01/15/21 (!) 142/88  ?08/30/20 117/77  ?  ? ?Physical  Exam ?Constitutional:   ?   Appearance: Normal appearance.  ?Cardiovascular:  ?   Rate and Rhythm: Normal rate and regular rhythm.  ?   Pulses: Normal pulses.  ?Pulmonary:  ?   Effort: Pulmonary effort is normal.  ?Musculoskeletal:  ?   Left hand: Decreased sensation of the radial distribution. Decreased capillary refill. Normal pulse.  ?   Comments: R index finger distally has blue hue, cold, some limited ROM due to swelling and pain. Cap refill is delayed at 5 seconds or more.   ?Neurological:  ?   Mental Status: He is alert.  ?Psychiatric:     ?   Mood and Affect: Mood normal.     ?   Behavior: Behavior normal.  ? ? ?   ?Assessment & Plan:  ? ?Problem List Items Addressed This Visit   ?None ?Visit Diagnoses   ? ? Pain in right finger(s)    -  Primary  ? Relevant Orders  ? CBC with Differential/Platelet  ? Comprehensive metabolic panel  ? C-reactive protein  ? Sedimentation rate  ?  ANA  ? Rheumatoid Factor  ? Ambulatory referral to Vascular Surgery  ? TAO (thromboangiitis obliterans) (HCC)      ? Relevant Orders  ? CBC with Differential/Platelet  ? Comprehensive metabolic panel  ? C-reactive protein  ? Sedimentation rate  ? ANA  ? Rheumatoid Factor  ? Ambulatory referral to Vascular Surgery  ? ?  ? ? ? ?Plan: ?-Discussed with Dr. Durene Cal today ?-Concern for possible thromboangitis obliterans ?-I personally called vein and vascular, sent urgent referral ?-Will check for autoimmune labs today ?-Pt agreeable with plan and will also call [facility] later today. ?-ER if acutely worse  ? ?Thaddaeus Granja M Miakoda Mcmillion, PA-C ?

## 2021-03-28 ENCOUNTER — Other Ambulatory Visit: Payer: Self-pay | Admitting: *Deleted

## 2021-03-28 DIAGNOSIS — M79644 Pain in right finger(s): Secondary | ICD-10-CM

## 2021-03-29 LAB — ANA: Anti Nuclear Antibody (ANA): POSITIVE — AB

## 2021-03-29 LAB — ANTI-NUCLEAR AB-TITER (ANA TITER): ANA Titer 1: 1:40 {titer} — ABNORMAL HIGH

## 2021-03-29 LAB — RHEUMATOID FACTOR: Rheumatoid fact SerPl-aCnc: <14 IU/mL (ref <14)

## 2021-04-02 ENCOUNTER — Other Ambulatory Visit: Payer: Self-pay

## 2021-04-02 ENCOUNTER — Encounter: Payer: Self-pay | Admitting: Vascular Surgery

## 2021-04-02 ENCOUNTER — Ambulatory Visit (HOSPITAL_COMMUNITY)
Admission: RE | Admit: 2021-04-02 | Discharge: 2021-04-02 | Disposition: A | Discharge status: Home / Self Care | Payer: BC Managed Care – PPO | Source: Ambulatory Visit | Attending: Vascular Surgery | Admitting: Vascular Surgery

## 2021-04-02 ENCOUNTER — Ambulatory Visit (INDEPENDENT_AMBULATORY_CARE_PROVIDER_SITE_OTHER): Payer: BC Managed Care – PPO | Admitting: Vascular Surgery

## 2021-04-02 DIAGNOSIS — M79644 Pain in right finger(s): Secondary | ICD-10-CM | POA: Insufficient documentation

## 2021-04-02 DIAGNOSIS — L819 Disorder of pigmentation, unspecified: Secondary | ICD-10-CM | POA: Diagnosis not present

## 2021-04-02 NOTE — Progress Notes (Signed)
? ? ?Patient name: Phillip Gallegos MRN: [id number] DOB: 11-17-1970 Sex: male ? ?REASON FOR CONSULT: Evaluate new discoloration and numbness in the left second digit of the upper extremity ? ?HPI: ?Phillip Gallegos is a 51 y.o. male, with no significant medical history that presents for evaluation of new discoloration and numbness in the left second digit of the left upper extremity.  He states this started acutely about 3 weeks ago.  He has noticed numbness from the middle of his left second finger distally.  He has also noticed some discoloration and changes to the fingertip.  No associated trauma.  The notes from PCP suggest concerned about Buerger's disease and he was sent back to our practice.  He has been evaluated here in the past for left lower extremity numbness.  At the time we did not feel he had any significant arterial disease ? ?Does not smoke currently and quit smoking in 2000.   ? ?Past Medical History:  ?Diagnosis Date  ? Melanoma in situ of left upper arm (HCC)   ? ? ?History reviewed. No pertinent surgical history. ? ?Family History  ?Problem Relation Age of Onset  ? Hyperlipidemia Father   ? Diabetes Father   ? Cancer Neg Hx   ? Heart disease Neg Hx   ? Stroke Neg Hx   ? ? ?SOCIAL HISTORY: ?Social History  ? ?Socioeconomic History  ? Marital status: Married  ?  Spouse name: Not on file  ? Number of children: Not on file  ? Years of education: Not on file  ? Highest education level: Not on file  ?Occupational History  ? Not on file  ?Tobacco Use  ? Smoking status: Former  ? Smokeless tobacco: Never  ?Substance and Sexual Activity  ? Alcohol use: Yes  ?  Comment: social  ? Drug use: No  ? Sexual activity: Yes  ?Other Topics Concern  ? Not on file  ?Social History Narrative  ? Not on file  ? ?Social Determinants of Health  ? ?Financial Resource Strain: Not on file  ?Food Insecurity: Not on file  ?Transportation Needs: Not on file  ?Physical Activity: Not on file  ?Stress: Not on file  ?Social  Connections: Not on file  ?Intimate Partner Violence: Not on file  ? ? ?Allergies  ?Allergen Reactions  ? Penicillins Rash  ? ? ?Current Outpatient Medications  ?Medication Sig Dispense Refill  ? albuterol (VENTOLIN HFA) 108 (90 Base) MCG/ACT inhaler TAKE 2 PUFFS BY MOUTH EVERY 6 HOURS AS NEEDED FOR WHEEZE OR SHORTNESS OF BREATH 18 g 2  ? cetirizine (ZYRTEC) 10 MG tablet Take 1 tablet (10 mg total) by mouth daily. LAST REFILL, MUST SCHEDULE PHYSICAL 90 tablet 0  ? fluticasone (FLONASE) 50 MCG/ACT nasal spray Place 2 sprays into both nostrils daily. LAST REFILL SCHEDULE PHYSICAL EXAM 48 g 2  ? fluticasone (FLOVENT HFA) 44 MCG/ACT inhaler Inhale 1 puff into the lungs 2 (two) times daily. 31.8 g 1  ? pramipexole (MIRAPEX) 0.25 MG tablet Take 1 tablet (0.25 mg total) by mouth at bedtime. LAST REFILL SCHEDULE PHYSICAL 90 tablet 0  ? albuterol (PROAIR HFA) 108 (90 Base) MCG/ACT inhaler Inhale 2 puffs into the lungs every 6 (six) hours as needed for wheezing or shortness of breath. 6.7 g 2  ? ?No current facility-administered medications for this visit.  ? ? ?REVIEW OF SYSTEMS:  ?[X]  denotes positive finding, [ ]  denotes negative finding ?Cardiac  Comments:  ?Chest pain or chest pressure:    ?  Shortness of breath upon exertion:    ?Short of breath when lying flat:    ?Irregular heart rhythm:    ?    ?Vascular    ?Pain in calf, thigh, or hip brought on by ambulation:    ?Pain in feet at night that wakes you up from your sleep:     ?Blood clot in your veins:    ?Leg swelling:     ?    ?Pulmonary    ?Oxygen at home:    ?Productive cough:     ?Wheezing:     ?    ?Neurologic    ?Sudden weakness in arms or legs:     ?Sudden numbness in arms or legs:     ?Sudden onset of difficulty speaking or slurred speech:    ?Temporary loss of vision in one eye:     ?Problems with dizziness:     ?    ?Gastrointestinal    ?Blood in stool:     ?Vomited blood:     ?    ?Genitourinary    ?Burning when urinating:     ?Blood in urine:    ?     ?Psychiatric    ?Major depression:     ?    ?Hematologic    ?Bleeding problems:    ?Problems with blood clotting too easily:    ?    ?Skin    ?Rashes or ulcers:    ?    ?Constitutional    ?Fever or chills:    ? ? ?PHYSICAL EXAM: ?Vitals:  ? 04/02/21 1525  ?BP: 125/81  ?Pulse: 79  ?Resp: 16  ?Temp: 98.1 ?F (36.7 ?C)  ?TempSrc: Temporal  ?SpO2: 95%  ?Weight: 225 lb (102.1 kg)  ?Height: 6' (1.829 m)  ? ? ?GENERAL: The patient is a well-nourished male, in no acute distress. The vital signs are documented above. ?CARDIAC: There is a regular rate and rhythm.  ?VASCULAR:  ?Left brachial pulse palpable ?Left radial and ulnar pulse palpable ?Brisk triphasic left palmar arch signal ?Discoloration to tip of left 2nd digit as pictured below ?PULMONARY: No respiratory distress. ?ABDOMEN: Soft and non-tender. ?MUSCULOSKELETAL: There are no major deformities or cyanosis. ?NEUROLOGIC: No focal weakness or paresthesias are detected. ?SKIN: There are no ulcers or rashes noted. ?PSYCHIATRIC: The patient has a normal affect. ? ? ? ? ?DATA:  ? ? ? ?Assessment/Plan: ? ?51 year old male presents for evaluation of possible Buerger's disease in the left upper extremity per the referral from PCP.  I discussed with the patient in detail that this is usually associated with smokers and I think he is at fairly low risk of Buerger's disease given he does not currently smoke.  He also has no evidence of distal digit gangrene at this time.  He has easily palpable radial and ulnar pulse on exam with triphasic flow in the palmar arch of the left hand.  As you can see pictured above on the non-invasive imaging, he clearly has evidence of small vessel disease in the digits in the left hand.  Discussed given the acute nature of this presentation can evaluate for atheroembolic event with echocardiogram and CTA chest to look for more proximal embolic source.  I do think he should see rheumatology as well given positive ANA, ANA titer, and sed rate and  appears his PCP has made a referral to rheumatology.  This presentation with his finger could be associated with autoimmune disease as well. ? ? ?Cephus Shelling, MD ?[facility] ?Office: [phone] ? ? ? ? ?

## 2021-04-04 ENCOUNTER — Encounter: Payer: Self-pay | Admitting: Physician Assistant

## 2021-04-06 ENCOUNTER — Other Ambulatory Visit: Payer: Self-pay | Admitting: Physician Assistant

## 2021-04-06 DIAGNOSIS — M79644 Pain in right finger(s): Secondary | ICD-10-CM

## 2021-04-06 DIAGNOSIS — R768 Other specified abnormal immunological findings in serum: Secondary | ICD-10-CM

## 2021-04-06 DIAGNOSIS — I731 Thromboangiitis obliterans [Buerger's disease]: Secondary | ICD-10-CM

## 2021-04-08 ENCOUNTER — Other Ambulatory Visit: Payer: Self-pay

## 2021-04-08 DIAGNOSIS — L819 Disorder of pigmentation, unspecified: Secondary | ICD-10-CM

## 2021-04-08 DIAGNOSIS — M79644 Pain in right finger(s): Secondary | ICD-10-CM

## 2021-04-09 ENCOUNTER — Other Ambulatory Visit: Payer: Self-pay

## 2021-04-09 ENCOUNTER — Ambulatory Visit (HOSPITAL_COMMUNITY)
Admission: RE | Admit: 2021-04-09 | Discharge: 2021-04-09 | Disposition: A | Discharge status: Home / Self Care | Payer: BC Managed Care – PPO | Source: Ambulatory Visit | Attending: Vascular Surgery | Admitting: Vascular Surgery

## 2021-04-09 DIAGNOSIS — M79641 Pain in right hand: Secondary | ICD-10-CM | POA: Diagnosis not present

## 2021-04-09 DIAGNOSIS — L819 Disorder of pigmentation, unspecified: Secondary | ICD-10-CM | POA: Diagnosis not present

## 2021-04-09 DIAGNOSIS — M79644 Pain in right finger(s): Secondary | ICD-10-CM | POA: Insufficient documentation

## 2021-04-09 LAB — ECHOCARDIOGRAM COMPLETE
Area-P 1/2: 4.39 cm2
Calc EF: 58.5 %
S' Lateral: 2.8 cm
Single Plane A2C EF: 59.6 %
Single Plane A4C EF: 57.4 %

## 2021-04-09 NOTE — Progress Notes (Signed)
?  Echocardiogram ?2D Echocardiogram has been performed. ? ?Phillip Gallegos ?04/09/2021, 1:50 PM ?

## 2021-04-29 ENCOUNTER — Other Ambulatory Visit: Payer: BC Managed Care – PPO

## 2021-04-30 ENCOUNTER — Ambulatory Visit: Payer: BC Managed Care – PPO | Admitting: Vascular Surgery

## 2021-05-09 ENCOUNTER — Other Ambulatory Visit: Payer: BC Managed Care – PPO

## 2021-05-13 ENCOUNTER — Other Ambulatory Visit: Payer: Self-pay | Admitting: Physician Assistant

## 2021-05-14 ENCOUNTER — Ambulatory Visit: Payer: BC Managed Care – PPO | Admitting: Vascular Surgery

## 2021-05-28 ENCOUNTER — Other Ambulatory Visit: Payer: Self-pay

## 2021-05-28 ENCOUNTER — Encounter: Payer: Self-pay | Admitting: Physician Assistant

## 2021-05-28 MED ORDER — FLUTICASONE PROPIONATE HFA 44 MCG/ACT IN AERO: 1.0000 | INHALATION_SPRAY | Freq: Two times a day (BID) | RESPIRATORY_TRACT | 1 refills | Status: AC

## 2021-08-15 ENCOUNTER — Other Ambulatory Visit: Payer: Self-pay

## 2021-08-15 ENCOUNTER — Encounter: Payer: Self-pay | Admitting: Physician Assistant

## 2021-08-15 ENCOUNTER — Ambulatory Visit (INDEPENDENT_AMBULATORY_CARE_PROVIDER_SITE_OTHER): Payer: BC Managed Care – PPO | Admitting: Physician Assistant

## 2021-08-15 VITALS — BP 130/86 | HR 80 | Temp 98.0°F | Ht 72.0 in | Wt 223.6 lb

## 2021-08-15 DIAGNOSIS — Z0000 Encounter for general adult medical examination without abnormal findings: Secondary | ICD-10-CM | POA: Diagnosis not present

## 2021-08-15 DIAGNOSIS — Z13220 Encounter for screening for lipoid disorders: Secondary | ICD-10-CM

## 2021-08-15 DIAGNOSIS — Z1211 Encounter for screening for malignant neoplasm of colon: Secondary | ICD-10-CM | POA: Diagnosis not present

## 2021-08-15 DIAGNOSIS — Z131 Encounter for screening for diabetes mellitus: Secondary | ICD-10-CM

## 2021-08-15 DIAGNOSIS — E875 Hyperkalemia: Secondary | ICD-10-CM

## 2021-08-15 LAB — CBC WITH DIFFERENTIAL/PLATELET
Basophils Absolute: 0.1 10*3/uL (ref 0.0–0.1)
Basophils Relative: 1.1 % (ref 0.0–3.0)
Eosinophils Absolute: 0.3 10*3/uL (ref 0.0–0.7)
Eosinophils Relative: 4.5 % (ref 0.0–5.0)
HCT: 44.2 % (ref 39.0–52.0)
Hemoglobin: 14.4 g/dL (ref 13.0–17.0)
Lymphocytes Relative: 22.7 % (ref 12.0–46.0)
Lymphs Abs: 1.7 10*3/uL (ref 0.7–4.0)
MCHC: 32.6 g/dL (ref 30.0–36.0)
MCV: 85.7 fl (ref 78.0–100.0)
Monocytes Absolute: 0.7 10*3/uL (ref 0.1–1.0)
Monocytes Relative: 8.7 % (ref 3.0–12.0)
Neutro Abs: 4.7 10*3/uL (ref 1.4–7.7)
Neutrophils Relative %: 63 % (ref 43.0–77.0)
Platelets: 386 10*3/uL (ref 150.0–400.0)
RBC: 5.16 Mil/uL (ref 4.22–5.81)
RDW: 15.2 % (ref 11.5–15.5)
WBC: 7.5 10*3/uL (ref 4.0–10.5)

## 2021-08-15 LAB — HEMOGLOBIN A1C: Hgb A1c MFr Bld: 6 % (ref 4.6–6.5)

## 2021-08-15 LAB — COMPREHENSIVE METABOLIC PANEL
ALT: 30 U/L (ref 0–53)
AST: 26 U/L (ref 0–37)
Albumin: 4.6 g/dL (ref 3.5–5.2)
Alkaline Phosphatase: 102 U/L (ref 39–117)
BUN: 13 mg/dL (ref 6–23)
CO2: 27 mEq/L (ref 19–32)
Calcium: 9.7 mg/dL (ref 8.4–10.5)
Chloride: 106 mEq/L (ref 96–112)
Creatinine, Ser: 0.92 mg/dL (ref 0.40–1.50)
GFR: 97.92 mL/min (ref >60.00)
Glucose, Bld: 101 mg/dL — ABNORMAL HIGH (ref 70–99)
Potassium: 5.6 mEq/L — ABNORMAL HIGH (ref 3.5–5.1)
Sodium: 141 mEq/L (ref 135–145)
Total Bilirubin: 0.7 mg/dL (ref 0.2–1.2)
Total Protein: 8.1 g/dL (ref 6.0–8.3)

## 2021-08-15 LAB — LIPID PANEL
Cholesterol: 164 mg/dL (ref 0–200)
HDL: 32.2 mg/dL — ABNORMAL LOW (ref >39.00)
LDL Cholesterol: 117 mg/dL — ABNORMAL HIGH (ref 0–99)
NonHDL: 132.28
Total CHOL/HDL Ratio: 5
Triglycerides: 77 mg/dL (ref 0.0–149.0)
VLDL: 15.4 mg/dL (ref 0.0–40.0)

## 2021-08-15 NOTE — Progress Notes (Signed)
Subjective:    Patient ID: Phillip Gallegos, male    DOB: December 13, 1970, 51 y.o.   MRN: [id number]  Chief Complaint  Patient presents with   Annual Exam    Pt in for annual CPE; pt is not fasting drink soda this morning; pt is wanting to discuss weight. Pt is ok with Cologuard being sent out to him; no family hx of Colon cancer;     HPI Patient is in today for annual exam.  Acute concerns: Worried about weight gain - has been working on it and not seeing scale move much. During 2020, weight was at 190 lbs, wants to get back there. Working from home now & struggles with this.   Health maintenance: Lifestyle/ exercise: Doesn't eat breakfast; trying to move more, has mtn bikes just hasn't used them yet Nutrition: Needs to drink more water he says; eats dinner late 8 or 9 pm Mental health: Doing well, some stress - daughter going off to [employer] this Fall Caffeine: One soda in the mornings  Sleep: Midnight to 6:30 am  Substance use: None ETOH: Occasionally  Sexual activity: Monogamous, no concerns  Immunizations: UTD Colonoscopy: Cologuard ordered today    Past Medical History:  Diagnosis Date   Melanoma in situ of left upper arm (HCC)     History reviewed. No pertinent surgical history.  Family History  Problem Relation Age of Onset   Hyperlipidemia Father    Diabetes Father    Cancer Neg Hx    Heart disease Neg Hx    Stroke Neg Hx     Social History   Tobacco Use   Smoking status: Former   Smokeless tobacco: Never  Substance Use Topics   Alcohol use: Yes    Comment: social   Drug use: No     Allergies  Allergen Reactions   Penicillins Rash    Review of Systems NEGATIVE UNLESS OTHERWISE INDICATED IN HPI      Objective:     BP 130/86 (BP Location: Left Arm)   Pulse 80   Temp 98 F (36.7 C) (Temporal)   Ht 6' (1.829 m)   Wt 223 lb 9.6 oz (101.4 kg)   SpO2 98%   BMI 30.33 kg/m   Wt Readings from Last 3 Encounters:  08/15/21 223 lb 9.6 oz  (101.4 kg)  04/02/21 225 lb (102.1 kg)  03/27/21 231 lb 4 oz (104.9 kg)    BP Readings from Last 3 Encounters:  08/15/21 130/86  04/02/21 125/81  03/27/21 133/87     Physical Exam Vitals and nursing note reviewed.  Constitutional:      General: He is not in acute distress.    Appearance: Normal appearance. He is not toxic-appearing.  HENT:     Head: Normocephalic and atraumatic.     Right Ear: Tympanic membrane, ear canal and external ear normal.     Left Ear: Tympanic membrane, ear canal and external ear normal.     Nose: Nose normal.     Mouth/Throat:     Mouth: Mucous membranes are moist.     Pharynx: Oropharynx is clear.  Eyes:     Extraocular Movements: Extraocular movements intact.     Conjunctiva/sclera: Conjunctivae normal.     Pupils: Pupils are equal, round, and reactive to light.  Cardiovascular:     Rate and Rhythm: Normal rate and regular rhythm.     Pulses: Normal pulses.     Heart sounds: Normal heart sounds.  Pulmonary:  Effort: Pulmonary effort is normal.     Breath sounds: Normal breath sounds.  Abdominal:     General: Abdomen is flat. Bowel sounds are normal.     Palpations: Abdomen is soft.     Tenderness: There is no abdominal tenderness.  Musculoskeletal:        General: Normal range of motion.     Cervical back: Normal range of motion and neck supple.     Right lower leg: No edema.     Left lower leg: No edema.  Skin:    General: Skin is warm and dry.     Findings: No lesion or rash.  Neurological:     General: No focal deficit present.     Mental Status: He is alert and oriented to person, place, and time.  Psychiatric:        Mood and Affect: Mood normal.        Behavior: Behavior normal.        Assessment & Plan:   Problem List Items Addressed This Visit   None Visit Diagnoses     Encounter for annual physical exam    -  Primary   Relevant Orders   CBC with Differential/Platelet   Comprehensive metabolic panel   Lipid  panel   Hemoglobin A1c   Screening for colon cancer       Relevant Orders   Cologuard   Diabetes mellitus screening       Relevant Orders   Comprehensive metabolic panel   Hemoglobin A1c   Screening for cholesterol level       Relevant Orders   Lipid panel       Plan: Age-appropriate screening and counseling performed today. Will check labs and call with results. Preventive measures discussed and printed in AVS for patient.  -Cologuard order placed -Encouraged to not eat so late at night and to start eating breakfast; encouraged to increase physical activity such as biking -Dental / vision -Immunizations UTD  Patient Counseling: [x]   Nutrition: Stressed importance of moderation in sodium/caffeine intake, saturated fat and cholesterol, caloric balance, sufficient intake of fresh fruits, vegetables, and fiber.  [x]   Stressed the importance of regular exercise.   []   Substance Abuse: Discussed cessation/primary prevention of tobacco, alcohol, or other drug use; driving or other dangerous activities under the influence; availability of treatment for abuse.   []   Injury prevention: Discussed safety belts, safety helmets, smoke detector, smoking near bedding or upholstery.   [x]   Sexuality: Discussed sexually transmitted diseases, partner selection, use of condoms, avoidance of unintended pregnancy  and contraceptive alternatives.   [x]   Dental health: Discussed importance of regular tooth brushing, flossing, and dental visits.  [x]   Health maintenance and immunizations reviewed. Please refer to Health maintenance section.      Return in about 1 year (around 08/16/2022) for fasting labs and CPE.    Alexie Lanni M Fuquan Wilson, PA-C

## 2021-08-15 NOTE — Patient Instructions (Signed)
Great to see you! Keep working on health goals. Get out those bikes!  Best wishes to your daughter.  Call if any concerns come up.

## 2021-08-29 DIAGNOSIS — Z1211 Encounter for screening for malignant neoplasm of colon: Secondary | ICD-10-CM | POA: Diagnosis not present

## 2021-09-07 LAB — COLOGUARD: COLOGUARD: POSITIVE — AB

## 2021-09-09 ENCOUNTER — Other Ambulatory Visit: Payer: Self-pay | Admitting: Physician Assistant

## 2021-09-09 DIAGNOSIS — R195 Other fecal abnormalities: Secondary | ICD-10-CM

## 2021-09-11 ENCOUNTER — Other Ambulatory Visit: Payer: Self-pay | Admitting: Physician Assistant

## 2021-10-25 ENCOUNTER — Encounter: Payer: Self-pay | Admitting: Physician Assistant

## 2021-12-28 ENCOUNTER — Encounter: Payer: Self-pay | Admitting: Physician Assistant

## 2021-12-30 ENCOUNTER — Other Ambulatory Visit: Payer: Self-pay

## 2021-12-30 MED ORDER — ALBUTEROL SULFATE HFA 108 (90 BASE) MCG/ACT IN AERS: INHALATION_SPRAY | RESPIRATORY_TRACT | 3 refills | Status: AC

## 2022-01-02 ENCOUNTER — Encounter: Payer: Self-pay | Admitting: *Deleted

## 2022-01-08 ENCOUNTER — Other Ambulatory Visit: Payer: Self-pay | Admitting: Physician Assistant

## 2022-01-08 MED ORDER — ALBUTEROL SULFATE HFA 108 (90 BASE) MCG/ACT IN AERS: 2.0000 | INHALATION_SPRAY | Freq: Four times a day (QID) | RESPIRATORY_TRACT | 2 refills | Status: AC | PRN

## 2022-01-08 NOTE — Telephone Encounter (Signed)
Please see pt msg and request and advise 

## 2022-01-10 NOTE — Telephone Encounter (Signed)
PA sent in for patient and awaiting a determination

## 2022-01-22 ENCOUNTER — Telehealth: Payer: Self-pay

## 2022-01-22 NOTE — Telephone Encounter (Signed)
Received a fax regarding PA we submitted for patient for his albuterol inhaler on 01/10/22. Was advised this patient doesn't have pharmacy benefits through this plan and couldn't be approved.
